# Patient Record
Sex: Female | Born: 1950
Health system: Southern US, Community
[De-identification: ages and names within clinical notes are randomized; demographics above are authoritative.]

## PROBLEM LIST (undated history)

## (undated) DIAGNOSIS — I341 Nonrheumatic mitral (valve) prolapse: Secondary | ICD-10-CM

## (undated) DIAGNOSIS — R5383 Other fatigue: Secondary | ICD-10-CM

## (undated) DIAGNOSIS — A281 Cat-scratch disease: Secondary | ICD-10-CM

## (undated) DIAGNOSIS — E041 Nontoxic single thyroid nodule: Secondary | ICD-10-CM

## (undated) DIAGNOSIS — B009 Herpesviral infection, unspecified: Secondary | ICD-10-CM

## (undated) DIAGNOSIS — Z6827 Body mass index (BMI) 27.0-27.9, adult: Secondary | ICD-10-CM

## (undated) DIAGNOSIS — R0609 Other forms of dyspnea: Secondary | ICD-10-CM

## (undated) DIAGNOSIS — E7800 Pure hypercholesterolemia, unspecified: Secondary | ICD-10-CM

## (undated) DIAGNOSIS — I10 Essential (primary) hypertension: Secondary | ICD-10-CM

## (undated) DIAGNOSIS — R0602 Shortness of breath: Secondary | ICD-10-CM

## (undated) HISTORY — DX: Cat-scratch disease: A28.1

## (undated) HISTORY — DX: Body mass index (BMI) 27.0-27.9, adult: Z68.27

## (undated) HISTORY — DX: Pure hypercholesterolemia, unspecified: E78.00

## (undated) HISTORY — DX: Nontoxic single thyroid nodule: E04.1

## (undated) HISTORY — DX: Other fatigue: R53.83

## (undated) HISTORY — DX: Other forms of dyspnea: R06.09

## (undated) HISTORY — DX: Nonrheumatic mitral (valve) prolapse: I34.1

## (undated) HISTORY — DX: Essential (primary) hypertension: I10

## (undated) HISTORY — DX: Herpesviral infection, unspecified: B00.9

## (undated) HISTORY — DX: Shortness of breath: R06.02

---

## 1997-07-26 ENCOUNTER — Other Ambulatory Visit: Admission: RE | Admit: 1997-07-26 | Discharge: 1997-07-26 | Payer: Self-pay | Admitting: Gynecology

## 1998-11-20 ENCOUNTER — Other Ambulatory Visit: Admission: RE | Admit: 1998-11-20 | Discharge: 1998-11-20 | Payer: Self-pay | Admitting: Gynecology

## 1999-12-25 ENCOUNTER — Other Ambulatory Visit: Admission: RE | Admit: 1999-12-25 | Discharge: 1999-12-25 | Payer: Self-pay | Admitting: Gynecology

## 2000-04-15 ENCOUNTER — Ambulatory Visit (HOSPITAL_COMMUNITY): Admission: RE | Admit: 2000-04-15 | Discharge: 2000-04-15 | Payer: Self-pay | Admitting: Gynecology

## 2001-05-19 ENCOUNTER — Other Ambulatory Visit: Admission: RE | Admit: 2001-05-19 | Discharge: 2001-05-19 | Payer: Self-pay | Admitting: Gynecology

## 2002-01-14 HISTORY — PX: TUBAL LIGATION: SHX77

## 2002-05-31 ENCOUNTER — Other Ambulatory Visit: Admission: RE | Admit: 2002-05-31 | Discharge: 2002-05-31 | Payer: Self-pay | Admitting: Gynecology

## 2003-06-06 ENCOUNTER — Other Ambulatory Visit: Admission: RE | Admit: 2003-06-06 | Discharge: 2003-06-06 | Payer: Self-pay | Admitting: Gynecology

## 2004-05-24 ENCOUNTER — Other Ambulatory Visit: Admission: RE | Admit: 2004-05-24 | Discharge: 2004-05-24 | Payer: Self-pay | Admitting: Gynecology

## 2005-05-07 ENCOUNTER — Other Ambulatory Visit: Admission: RE | Admit: 2005-05-07 | Discharge: 2005-05-07 | Payer: Self-pay | Admitting: Obstetrics & Gynecology

## 2006-01-14 HISTORY — PX: KNEE SURGERY: SHX244

## 2006-05-09 ENCOUNTER — Other Ambulatory Visit: Admission: RE | Admit: 2006-05-09 | Discharge: 2006-05-09 | Payer: Self-pay | Admitting: Obstetrics and Gynecology

## 2007-07-07 ENCOUNTER — Other Ambulatory Visit: Admission: RE | Admit: 2007-07-07 | Discharge: 2007-07-07 | Payer: Self-pay | Admitting: Obstetrics & Gynecology

## 2007-12-15 HISTORY — PX: SHOULDER SURGERY: SHX246

## 2007-12-23 ENCOUNTER — Ambulatory Visit (HOSPITAL_COMMUNITY): Admission: RE | Admit: 2007-12-23 | Discharge: 2007-12-24 | Payer: Self-pay | Admitting: Orthopedic Surgery

## 2008-01-15 HISTORY — PX: TUBAL LIGATION: SHX77

## 2008-03-14 HISTORY — PX: KNEE ARTHROSCOPY: SHX127

## 2010-01-03 ENCOUNTER — Ambulatory Visit (HOSPITAL_COMMUNITY)
Admission: RE | Admit: 2010-01-03 | Discharge: 2010-01-03 | Payer: Self-pay | Source: Home / Self Care | Attending: Obstetrics and Gynecology | Admitting: Obstetrics and Gynecology

## 2010-01-18 ENCOUNTER — Encounter
Admission: RE | Admit: 2010-01-18 | Discharge: 2010-01-18 | Payer: Self-pay | Source: Home / Self Care | Attending: Obstetrics and Gynecology | Admitting: Obstetrics and Gynecology

## 2010-01-23 ENCOUNTER — Encounter
Admission: RE | Admit: 2010-01-23 | Discharge: 2010-01-23 | Payer: Self-pay | Source: Home / Self Care | Attending: Obstetrics and Gynecology | Admitting: Obstetrics and Gynecology

## 2010-01-23 HISTORY — PX: BREAST BIOPSY: SHX20

## 2010-02-14 ENCOUNTER — Encounter: Payer: Self-pay | Admitting: Obstetrics and Gynecology

## 2010-04-25 ENCOUNTER — Inpatient Hospital Stay (HOSPITAL_COMMUNITY): Payer: PRIVATE HEALTH INSURANCE

## 2010-04-25 ENCOUNTER — Inpatient Hospital Stay (HOSPITAL_COMMUNITY)
Admission: EM | Admit: 2010-04-25 | Discharge: 2010-04-26 | DRG: 603 | Disposition: A | Discharge status: Home / Self Care | Payer: PRIVATE HEALTH INSURANCE | Attending: Internal Medicine | Admitting: Internal Medicine

## 2010-04-25 DIAGNOSIS — W64XXXA Exposure to other animate mechanical forces, initial encounter: Secondary | ICD-10-CM | POA: Diagnosis present

## 2010-04-25 DIAGNOSIS — IMO0002 Reserved for concepts with insufficient information to code with codable children: Secondary | ICD-10-CM | POA: Diagnosis present

## 2010-04-25 DIAGNOSIS — Y92009 Unspecified place in unspecified non-institutional (private) residence as the place of occurrence of the external cause: Secondary | ICD-10-CM

## 2010-04-25 DIAGNOSIS — L02419 Cutaneous abscess of limb, unspecified: Principal | ICD-10-CM | POA: Diagnosis present

## 2010-04-25 DIAGNOSIS — Z880 Allergy status to penicillin: Secondary | ICD-10-CM

## 2010-04-25 DIAGNOSIS — L03119 Cellulitis of unspecified part of limb: Principal | ICD-10-CM | POA: Diagnosis present

## 2010-04-25 LAB — COMPREHENSIVE METABOLIC PANEL
ALT: 18 U/L (ref 0–35)
Albumin: 3.7 g/dL (ref 3.5–5.2)
CO2: 24 mEq/L (ref 19–32)
Calcium: 9.2 mg/dL (ref 8.4–10.5)
Creatinine, Ser: 0.79 mg/dL (ref 0.4–1.2)
Glucose, Bld: 104 mg/dL — ABNORMAL HIGH (ref 70–99)
Potassium: 4.1 mEq/L (ref 3.5–5.1)
Total Protein: 7 g/dL (ref 6.0–8.3)

## 2010-04-25 LAB — URINALYSIS, ROUTINE W REFLEX MICROSCOPIC
Bilirubin Urine: NEGATIVE
Hgb urine dipstick: NEGATIVE
Nitrite: NEGATIVE
Protein, ur: NEGATIVE mg/dL

## 2010-04-25 LAB — DIFFERENTIAL
Eosinophils Absolute: 0.1 10*3/uL (ref 0.0–0.7)
Eosinophils Relative: 1 % (ref 0–5)
Lymphocytes Relative: 20 % (ref 12–46)
Monocytes Absolute: 1 10*3/uL (ref 0.1–1.0)
Neutro Abs: 8 10*3/uL — ABNORMAL HIGH (ref 1.7–7.7)

## 2010-04-25 LAB — URINE MICROSCOPIC-ADD ON

## 2010-04-25 LAB — CBC
HCT: 38.3 % (ref 36.0–46.0)
MCH: 27.9 pg (ref 26.0–34.0)
MCV: 82.2 fL (ref 78.0–100.0)
Platelets: 209 10*3/uL (ref 150–400)
WBC: 11.5 10*3/uL — ABNORMAL HIGH (ref 4.0–10.5)

## 2010-04-25 MED ORDER — GADOBENATE DIMEGLUMINE 529 MG/ML IV SOLN
15.0000 mL | Freq: Once | INTRAVENOUS | Status: AC | PRN
  Administered 2010-04-25: 15 mL via INTRAVENOUS

## 2010-04-26 LAB — DIFFERENTIAL
Basophils Absolute: 0 10*3/uL (ref 0.0–0.1)
Eosinophils Relative: 4 % (ref 0–5)
Lymphs Abs: 2.7 10*3/uL (ref 0.7–4.0)
Neutro Abs: 3.9 10*3/uL (ref 1.7–7.7)

## 2010-04-26 LAB — CBC
HCT: 35.8 % — ABNORMAL LOW (ref 36.0–46.0)
Hemoglobin: 11.7 g/dL — ABNORMAL LOW (ref 12.0–15.0)
MCHC: 32.7 g/dL (ref 30.0–36.0)
MCV: 83.8 fL (ref 78.0–100.0)
Platelets: 176 10*3/uL (ref 150–400)
RDW: 13.5 % (ref 11.5–15.5)

## 2010-04-30 NOTE — H&P (Addendum)
Shannon Dickson, Shannon Dickson                  ACCOUNT NO.:  [id number]  MEDICAL RECORD NO.:  [id number]           PATIENT TYPE:  E  LOCATION:  WLED                         FACILITY:  [facility]  PHYSICIAN:  Richarda Overlie, MD       DATE OF BIRTH:  [birth year]  DATE OF ADMISSION:  04/25/2010 DATE OF DISCHARGE:                             HISTORY & PHYSICAL   PRIMARY CARE PROVIDER:  None.  The patient is being admitted to [facility], [employer] [facility].  CHIEF COMPLAINT:  Left knee pain.  HISTORY OF PRESENT ILLNESS:  Ms. Shannon Dickson is a very pleasant 60 year old with virtually no medical history, presents to [facility] ED with chief complaint of left knee pain.  Information is obtained from the patient. She reports that 2 days ago she suffered a scratch from a stray cat that she had taken into her home previously.  By that evening, she indicated that the knee had developed erythema and swelling.  She states that she put ice on it without relief.  During the night, she developed a headache, some chills, subjective fever.  The next morning, she went to Urgent Care and was given doxycycline and clindamycin and the area was marked with ink.  She indicates that she woke up this morning and the erythema was spreading up her thigh and down her shin, and she also developed pain with ambulation.  She became concerned, so she came back to the emergency room.  Symptoms came on gradually, have persisted and worsened.  She rates the pain a 6/10 at worst and a 2/10 at best.  She states that bearing weight makes the pain worse and lying still makes it better.  We are asked to admit for further evaluation and treatment.  ALLERGIES:  PENICILLIN.  PAST MEDICAL HISTORY:  None.  PAST SURGICAL HISTORY: 1. Right shoulder rotator cuff repair. 2. Tubal ligation. 3. Bilateral knee arthroscopies.  SOCIAL HISTORY:  She denies tobacco use.  Denies EtOH.  Denies drug use. She is single.  She lives alone.  She is  self-employed.  FAMILY MEDICAL HISTORY:  She has a twin brother who is in good health. She has a sister who is deceased at age 65 from complications of diabetes.  She has a mother who is 61 and a diabetic.  Her father is deceased at 63 years of age; cause is unclear.  MEDICATIONS: 1. Doxycycline 100 mg p.o. 1 cap b.i.d., started April 10 for a 14-day     therapy. 2. Clindamycin 300 mg p.o. b.i.d., started April 24, 2010, for 10-day     therapy. 3. Claritin 10 mg p.o. daily as needed for allergies. 4. Fish oil over-the-counter p.o. 1 cap daily. 5. Multivitamins p.o. 1 tab daily.  REVIEW OF SYSTEMS:  A 10-point system review conducted and all systems are negative except as indicated in the HPI.  LABS:  Sodium 137, potassium 4.1, chloride 107, CO2 24, BUN 15, creatinine 0.79, glucose 104.  WBCs 11.5, hemoglobin 13.0, hematocrit 38.3, platelets 209, absolute neutrophils 8.0.  PHYSICAL EXAM:  VITAL SIGNS:  Temperature 98.3, BP 126/63, heart rate  71, respirations 16, sats 97% on room air. GENERAL:  Awake, alert, sitting up in bed eating a sandwich, no acute distress. HEAD:  Normocephalic, atraumatic.  Pupils equal, round, and reactive to light.  EOMI.  Mucous membranes of her mouth are moist and pink.  No obvious lesion or exudate in her nose or ears. NECK:  Supple.  No JVD.  Full range of motion.  No lymphadenopathy. CV:  Regular rate and rhythm.  No murmur, gallop, or rub.  No lower extremity edema. RESPIRATORY:  No increased work of breathing.  Breath sounds clear to auscultation bilaterally.  No rhonchi, wheezes, or rales. ABDOMEN:  Flat, soft, positive bowel sounds throughout, nontender to palpation.  No mass or organomegaly noted. NEURO:  Alert and oriented x3.  Speech clear.  Facial symmetry.  Cranial nerves 2-12 grossly intact. MUSCULOSKELETAL:  Moves all extremities spontaneously.  Left knee with mild swelling and moderate erythema anteriorly.  Erythema extending above and  below the knee.  Warm and tender to touch.  Question mild streaking to upper thigh.  No fluctuance of knee or tightness/shininess to the skin.  ASSESSMENT/PLAN: 1. Cellulitis of the left knee/question septic bursitis secondary to     cat scratch.  Admit to regular bed, provide intravenous vancomycin     and imipenem.  Will monitor closely.  Will get an MRI of the knee     and the left distal femur to rule out septic arthritis and septic     bursitis/abscess. 2. Mild leukocytosis secondary to #1.  Intravenous antibiotics as     indicated in above.  Will check CBC in the a.m. 3. Deep vein thrombosis prophylaxis.  Will use Lovenox. 4. Code status:  The patient is a full code.  Assessment and plan discussed with Dr. Susie Cassette.     Gwenyth Bender, NP   ______________________________ Richarda Overlie, MD    KMB/MEDQ  D:  04/25/2010  T:  04/25/2010  Job:  [id number]  Electronically Signed by Richarda Overlie MD on 04/29/2010 04:30:14 PM Electronically Signed by Toya Smothers  on 05/01/2010 10:27:15 AM

## 2010-05-07 NOTE — Discharge Summary (Signed)
  NAMEFUMIKO, Shannon Dickson                  ACCOUNT NO.:  [id number]  MEDICAL RECORD NO.:  [id number]           PATIENT TYPE:  I  LOCATION:  1341                         FACILITY:  [facility]  PHYSICIAN:  Hollice Espy, M.D.DATE OF BIRTH:  Dec 19, 1950  DATE OF ADMISSION:  04/25/2010 DATE OF DISCHARGE:  04/26/2010                              DISCHARGE SUMMARY   PRIMARY CARE PHYSICIAN:  She does not have a primary care physician.  I have given her number of several primary care physicians and she previously was receiving her followups at Urgent Care.  DISCHARGE DIAGNOSIS:  Cellulitis of the right thigh felt to be secondary to a cat scratch.  DISCHARGE MEDICATIONS:  Bactrim DS 1 p.o. b.i.d. x6 days.  HOSPITAL COURSE:  The patient is a 60-year white female with no essential past medical history other than seasonal rhinitis who presented to the emergency room on April 25, 2010.  She had been scratched the day before and had started given some redness and swelling.  She went to Urgent Care and was given prescriptions for doxycycline and clindamycin.  She started taking those but swelling had increased and the erythema had gone up her leg.  She then went to the emergency room on April 25, 2010.  She was brought in, her white count was mildly elevated, and she was started on broad-spectrum IV antibiotics, specifically Zosyn and vancomycin.  By hospital day #2, her white count had normalized.  She was afebrile, feeling better.  The redness had gone down significantly and the plan is for the patient to be discharged home with 6 more days of p.o. Bactrim.  We are not able to use Augmentin because of her penicillin allergy.  DISPOSITION:  Improved.  ACTIVITY:  Slowly increased.  DISCHARGE DIET:  Regular diet and she is being discharged to home.     Hollice Espy, M.D.     SKK/MEDQ  D:  04/26/2010  T:  04/27/2010  Job:  [id number]  Electronically Signed by Virginia Rochester M.D. on  05/07/2010 02:02:04 PM

## 2010-05-29 NOTE — Op Note (Signed)
Shannon Dickson, Shannon Dickson                  ACCOUNT NO.:  [id number]   MEDICAL RECORD NO.:  [id number]          PATIENT TYPE:  AMB   LOCATION:  DAY                          FACILITY:  [facility]   PHYSICIAN:  Ronald A. Gioffre, M.D.DATE OF BIRTH:  December 03, 1950   DATE OF PROCEDURE:  12/23/2007  DATE OF DISCHARGE:                               OPERATIVE REPORT   ASSISTANT:  Jamelle Rushing, P.A.   PREOPERATIVE DIAGNOSES:  1. Severe impingement syndrome right shoulder.  2. Torn rotator cuff tendon right shoulder secondary to the      impingement.  3. Questionable subluxing long head of the biceps.   POSTOPERATIVE DIAGNOSES:  1. Severe impingement syndrome right shoulder.  2. Partial tear rotator cuff tendon right shoulder.   OPERATIVE PROCEDURES:  1. Open acromionectomy and acromioplasty utilizing the oscillating saw      and bur.  2. Repair of the rotator cuff tendon tear, partial-thickness utilizing      a tissue mend graft.  We also used one anchor.   PROCEDURE:  Under general anesthesia routine orthopedic prep and drape  of the right upper extremity was carried out.  She had clindamycin 600  mg IV.  Following that,  incision was made over the anterior aspect of  the right shoulder.  Bleeders were identified and cauterized.  I then  detached by sharp dissection the deltoid tendon from the superior aspect  of the acromion both medially and laterally and went down and noted a  rather severe impingement syndrome - type picture.  Her acromion  literally was embedding into the rotator cuff when she came up into  abduction.  I protected the cuff after removing a large chronically  inflamed  subdeltoid bursa.  The cuff then was protected with a Cytogeneticist.   I utilized the oscillating saw and did a partial acromionectomy and  acromioplasty with a bur and thoroughly irrigated out the area and then  made sure we had a nice smooth surface of the undersurface of the  acromion and I bone waxed  that.  We thoroughly irrigated out the area  and went down and examined and the long head of the biceps was not  exposed.  I did examine it,  both rotating the shoulder internally and  externally and abducting shoulder and there certainly it was definitely  intact in the groove and there was no tenodesis indicated.  There were  no other abnormalities noted at the time of surgery here.   We thoroughly irrigated out the area and I then utilized a tissue mend  graft, a  small graft 2 x 2 cm.  I inserted one anchor in the proximal  humerus and sutured the graft back down over the repair site of the  tendon.  I thoroughly irrigated out the area, reapproximated the deltoid  tendon with #1 Ethibond suture and the remaining part of muscle  was closed with 0 Vicryl, subcu with 0Vicryl, skin with metal staples.  Prior to closing the wound I did insert some thrombin-soaked Gelfoam in  the subacromial space.  Sterile  dressings were applied.  The patient was  placed in a shoulder immobilizer.           ______________________________  Georges Lynch Darrelyn Hillock, M.D.     RAG/MEDQ  D:  12/23/2007  T:  12/23/2007  Job:  [id number]

## 2010-06-01 NOTE — Op Note (Signed)
[facility]  Patient:    Shannon Dickson, Shannon Dickson                         MRN: [id number] Proc. Date: 04/15/00 Adm. Date:  [id number] Attending:  Susa Raring                           Operative Report  PREOPERATIVE DIAGNOSIS:  Sterilization.  POSTOPERATIVE DIAGNOSES: 1. Sterilization. 2. Small subserosal fibroids.  OPERATION:  Laparoscopy and tubal sterilization.  SURGEON:  Luvenia Redden, M.D.  PROCEDURE:  Under good anesthesia the patient was prepped and draped in a sterile manner.  The bladder was catheterized and a Hulka tenaculum placed on the uterus to achieve mobility.  A subumbilical incision was made, a Veress needle inserted intraperitoneally.  Placement was verified by negative pressure reading on the manometer, without protraction of the abdominal wall. Pneumoperitoneum was formed with carbon dioxide.  The needle was removed.  The laparoscopic trocar and cannula was introduced.  The trocar removed and the operative laparoscope introduced.  Under direct vision, pelvic organs were viewed after the small intestine was mobilized out of the area.  The uterus was of normal size; it was anterior.  There were two small subserosal fibroids present on the fundus anteriorly, and one posteriorly.  These were approximately 5-10 mm in diameter.  Tubes and ovaries appeared normal.  The pelvic peritoneum appeared normal.  There was a corpus luteum present in the right ovary.  The right fallopian tube was identified throughout its length, isolated, grasped at the junction of proximal middle third, tented up and cauterized with bipolar instrument.  The tube was then transected in the mid portion of the cauterized area, and there was no bleeding.  The left fallopian tube was identified throughout its length, grasped at the junction of the proximal middle third, tented up and cauterized to 0 with bipolar instrument. The tube was then transected in the mid  portion of the cauterized area.  There was no bleeding from this operative site.  The operative sites were reviewed under reduced intraperitoneal pressure, and there was no active bleeding. Pneumoperitoneum was completely released.  Instruments were removed.  The skin incision was closed with subcuticular 2-0 plain catgut.  Band-Aid was applied.  Blood loss was estimated less than 10 cc; none was replaced.  The patient tolerated the procedure well and was removed to the recovery room in good condition. DD:  04/15/00 TD:  04/15/00 Job: [id number]

## 2010-10-18 LAB — DIFFERENTIAL
Basophils Absolute: 0 10*3/uL (ref 0.0–0.1)
Basophils Relative: 0 % (ref 0–1)
Eosinophils Absolute: 0.2 10*3/uL (ref 0.0–0.7)
Eosinophils Relative: 3 % (ref 0–5)
Lymphs Abs: 1.9 10*3/uL (ref 0.7–4.0)
Neutrophils Relative %: 66 % (ref 43–77)

## 2010-10-18 LAB — URINALYSIS, ROUTINE W REFLEX MICROSCOPIC
Ketones, ur: NEGATIVE mg/dL
Specific Gravity, Urine: 1.018 (ref 1.005–1.030)

## 2010-10-18 LAB — PROTIME-INR
INR: 1 (ref 0.00–1.49)
Prothrombin Time: 12.9 seconds (ref 11.6–15.2)

## 2010-10-18 LAB — COMPREHENSIVE METABOLIC PANEL
ALT: 15 U/L (ref 0–35)
Alkaline Phosphatase: 69 U/L (ref 39–117)
BUN: 10 mg/dL (ref 6–23)
Glucose, Bld: 87 mg/dL (ref 70–99)
Potassium: 3.7 mEq/L (ref 3.5–5.1)
Total Bilirubin: 0.7 mg/dL (ref 0.3–1.2)
Total Protein: 7.5 g/dL (ref 6.0–8.3)

## 2010-10-18 LAB — URINE CULTURE: Colony Count: 40000

## 2010-10-18 LAB — CBC
HCT: 44.3 % (ref 36.0–46.0)
Hemoglobin: 15 g/dL (ref 12.0–15.0)
MCHC: 33.8 g/dL (ref 30.0–36.0)
MCV: 85.2 fL (ref 78.0–100.0)
Platelets: 318 10*3/uL (ref 150–400)
RBC: 5.2 MIL/uL — ABNORMAL HIGH (ref 3.87–5.11)
RDW: 13.2 % (ref 11.5–15.5)
WBC: 7.2 10*3/uL (ref 4.0–10.5)

## 2010-10-18 LAB — TYPE AND SCREEN
ABO/RH(D): O POS
Antibody Screen: NEGATIVE

## 2010-10-18 LAB — ABO/RH: ABO/RH(D): O POS

## 2011-02-26 ENCOUNTER — Other Ambulatory Visit (HOSPITAL_COMMUNITY): Payer: Self-pay | Admitting: Occupational Therapy

## 2011-10-16 LAB — HM COLONOSCOPY

## 2011-12-23 ENCOUNTER — Encounter: Payer: Self-pay | Admitting: Cardiovascular Disease

## 2011-12-23 ENCOUNTER — Ambulatory Visit (INDEPENDENT_AMBULATORY_CARE_PROVIDER_SITE_OTHER): Payer: BC Managed Care – PPO | Admitting: Cardiovascular Disease

## 2011-12-23 VITALS — BP 136/72 | HR 61 | Ht 64.0 in | Wt 147.8 lb

## 2011-12-23 DIAGNOSIS — R0989 Other specified symptoms and signs involving the circulatory and respiratory systems: Secondary | ICD-10-CM

## 2011-12-23 DIAGNOSIS — R0600 Dyspnea, unspecified: Secondary | ICD-10-CM

## 2011-12-23 NOTE — Progress Notes (Signed)
    Shannon Dickson Date of Birth  1950-04-03       [facility]    [place] [address]  [address], [address] [place], [place]  [address]   [place], [place]  [address] [phone]     [phone]   Fax  [phone]    Fax [phone]  Problem List: 1. Dyspnea 2.   History of Present Illness:  Shannon Dickson is a 61 yo with chronic dyspnea.   She denies any chest pain.  Family history of asthma. She works in [employer].  She has exercised regularly in the past. but hasn't in the past.  No syncope. No Chest pain.    does not sleep well at night.  She had a cardiopulmonary stress test about 4-5 years ago which was normal.   No current outpatient prescriptions on file prior to visit.    Allergies  Allergen Reactions  . Penicillins     Not Pure but can have it mixed with other things    Past Medical History  Diagnosis Date  . SOB (shortness of breath)   . MVP (mitral valve prolapse)     about 30 yrs ago    Past Surgical History  Procedure Date  . Knee surgery   . Tubal ligation   . Shoulder surgery     right    History  Smoking status  . Never Smoker   Smokeless tobacco  . Not on file    History  Alcohol Use  . Yes    Comment: Social    Family History  Problem Relation Age of Onset  . Asthma      aunt    Reviw of Systems:  Reviewed in the HPI.  All other systems are negative.  Physical Exam: Blood pressure 136/72, pulse 61, height 5\' 4"  (1.626 m), weight 147 lb 12.8 oz (67.042 kg). General: Well developed, well nourished, in no acute distress.  Head: Normocephalic, atraumatic, sclera non-icteric, mucus membranes are moist,   Neck: Supple. Carotids are 2 + without bruits. No JVD   Lungs: Clear   Heart: RR, soft systolic murmur  Abdomen: Soft, non-tender, non-distended with normal bowel sounds.  Msk:  Strength and tone are normal   Extremities: No clubbing or cyanosis. No edema.  Distal pedal pulses are 2+ and equal      Neuro: CN II - XII intact.  Alert and oriented X 3.   Psych:  Normal   ECG: 12/23/2011: Sinus bradycardia 58. EKG is otherwise normal.  Assessment / Plan:

## 2011-12-23 NOTE — Assessment & Plan Note (Signed)
Shannon Dickson presents for further evaluation of dyspnea with exertion. She overall is a very healthy 61 year old female. She has noticed exertional dyspnea for the past 20 years. She thinks that she ought to be able to do more than she does since  she is in good shape. She has noticed that she's always breathing harder than her "workout buddies".  We will get an echocardiogram for further evaluation cardiac structure. She may have some degree of diastolic dysfunction. I would also like to do a Cardiolite stress test since I will help to differentiate a cardiac etiology versus a pulmonary etiology. If these turn out to be normal then we will refer her back to her medical Dr.  I will see  her again in several months to discuss the results of these.

## 2011-12-23 NOTE — Patient Instructions (Addendum)
Your physician has recommended that you have a cardiopulmonary stress test (CPX). CPX testing is a non-invasive measurement of heart and lung function. It replaces a traditional treadmill stress test. This type of test provides a tremendous amount of information that relates not only to your present condition but also for future outcomes. This test combines measurements of you ventilation, respiratory gas exchange in the lungs, electrocardiogram (EKG), blood pressure and physical response before, during, and following an exercise protocol.  Your physician has requested that you have an echocardiogram. Echocardiography is a painless test that uses sound waves to create images of your heart. It provides your doctor with information about the size and shape of your heart and how well your heart's chambers and valves are working. This procedure takes approximately one hour. There are no restrictions for this procedure.  Your physician recommends that you schedule a follow-up appointment in: 1-2 months with Dr Elease Hashimoto

## 2012-01-13 ENCOUNTER — Encounter (HOSPITAL_COMMUNITY): Payer: BC Managed Care – PPO

## 2012-01-13 ENCOUNTER — Other Ambulatory Visit (HOSPITAL_COMMUNITY): Payer: BC Managed Care – PPO

## 2012-01-15 DIAGNOSIS — R0602 Shortness of breath: Secondary | ICD-10-CM

## 2012-01-15 HISTORY — DX: Shortness of breath: R06.02

## 2012-02-03 ENCOUNTER — Ambulatory Visit (HOSPITAL_COMMUNITY): Payer: BC Managed Care – PPO | Attending: Cardiovascular Disease

## 2012-02-03 ENCOUNTER — Ambulatory Visit (HOSPITAL_COMMUNITY): Payer: BC Managed Care – PPO | Attending: Cardiology | Admitting: Radiology

## 2012-02-03 DIAGNOSIS — I369 Nonrheumatic tricuspid valve disorder, unspecified: Secondary | ICD-10-CM | POA: Insufficient documentation

## 2012-02-03 DIAGNOSIS — R0989 Other specified symptoms and signs involving the circulatory and respiratory systems: Secondary | ICD-10-CM | POA: Insufficient documentation

## 2012-02-03 DIAGNOSIS — R0609 Other forms of dyspnea: Secondary | ICD-10-CM | POA: Insufficient documentation

## 2012-02-03 DIAGNOSIS — I059 Rheumatic mitral valve disease, unspecified: Secondary | ICD-10-CM | POA: Insufficient documentation

## 2012-02-03 DIAGNOSIS — R0600 Dyspnea, unspecified: Secondary | ICD-10-CM

## 2012-02-03 NOTE — Progress Notes (Signed)
Echocardiogram performed.  

## 2012-02-06 ENCOUNTER — Telehealth: Payer: Self-pay | Admitting: Cardiovascular Disease

## 2012-02-06 NOTE — Telephone Encounter (Signed)
New problem:  Test results.  

## 2012-02-06 NOTE — Telephone Encounter (Signed)
See echo note, results given

## 2012-02-18 ENCOUNTER — Ambulatory Visit (INDEPENDENT_AMBULATORY_CARE_PROVIDER_SITE_OTHER): Payer: BC Managed Care – PPO | Admitting: Cardiovascular Disease

## 2012-02-18 ENCOUNTER — Encounter: Payer: Self-pay | Admitting: Cardiovascular Disease

## 2012-02-18 VITALS — BP 141/75 | HR 60 | Ht 64.0 in | Wt 148.4 lb

## 2012-02-18 DIAGNOSIS — R0600 Dyspnea, unspecified: Secondary | ICD-10-CM

## 2012-02-18 DIAGNOSIS — R0989 Other specified symptoms and signs involving the circulatory and respiratory systems: Secondary | ICD-10-CM

## 2012-02-18 NOTE — Patient Instructions (Addendum)
Your physician recommends that you schedule a follow-up appointment in: AS NEEDED  Your physician recommends that you continue on your current medications as directed. Please refer to the Current Medication list given to you today.  

## 2012-02-18 NOTE — Assessment & Plan Note (Signed)
Ensley has had a normal cardiopulmonary stress test and an unremarkable echocardiogram. She has normal left ventricular systolic function. She does have some mild diastolic dysfunction. She states that she has been short of breath while exercising almost all of her life.  At this point I don't have anything further to offer. I recommended that she a Systems analyst to help her with her workout. I don't think there is anything structurally wrong with her heart that we can fix. I'll see her back as needed.

## 2012-02-18 NOTE — Progress Notes (Signed)
    Hans Eden Date of Birth  1950/01/17       [facility]    [place] [address]  [address] [place], [place]  [address]   [place], [place]  [address] [phone]     [phone]   Fax  [phone]    Fax [phone]  Problem List: 1. Dyspnea   History of Present Illness:  Shannon Dickson is a 62 yo with chronic dyspnea.   She denies any chest pain.  Family history of asthma. She works in [employer].  She has exercised regularly in the past. but hasn't in the past.  No syncope. No Chest pain.    does not sleep well at night.  She had a cardiopulmonary stress test about 4-5 years ago which was normal.   Feb. 4, 2014: Makenzye  had a cardiopulmonary stress test on her last visit which was normal. She also had an echocardiogram which revealed normal left ventricular systolic function. She had not mild diastolic dysfunction.  Current Outpatient Prescriptions on File Prior to Visit  Medication Sig Dispense Refill  . cholecalciferol (VITAMIN D) 1000 UNITS tablet Take 1,000 Units by mouth daily.      . Multiple Vitamins-Minerals (MULTIVITAMIN PO) Take by mouth.        Allergies  Allergen Reactions  . Penicillins     Not Pure but can have it mixed with other things    Past Medical History  Diagnosis Date  . SOB (shortness of breath)   . MVP (mitral valve prolapse)     about 30 yrs ago    Past Surgical History  Procedure Date  . Knee surgery   . Tubal ligation   . Shoulder surgery     right    History  Smoking status  . Never Smoker   Smokeless tobacco  . Not on file    History  Alcohol Use  . Yes    Comment: Social    Family History  Problem Relation Age of Onset  . Asthma      aunt    Reviw of Systems:  Reviewed in the HPI.  All other systems are negative.  Physical Exam: Blood pressure 141/75, pulse 60, height 5\' 4"  (1.626 m), weight 148 lb 6.4 oz (67.314 kg). General: Well developed, well nourished, in no acute  distress.  Head: Normocephalic, atraumatic, sclera non-icteric, mucus membranes are moist,   Neck: Supple. Carotids are 2 + without bruits. No JVD   Lungs: Clear   Heart: RR, soft systolic murmur  Abdomen: Soft, non-tender, non-distended with normal bowel sounds.  Msk:  Strength and tone are normal   Extremities: No clubbing or cyanosis. No edema.  Distal pedal pulses are 2+ and equal    Neuro: CN II - XII intact.  Alert and oriented X 3.   Psych:  Normal   ECG:  Assessment / Plan:

## 2012-04-14 ENCOUNTER — Ambulatory Visit
Admission: RE | Admit: 2012-04-14 | Discharge: 2012-04-14 | Disposition: A | Discharge status: Home / Self Care | Payer: BC Managed Care – PPO | Source: Ambulatory Visit | Attending: Otolaryngology | Admitting: Otolaryngology

## 2012-04-14 ENCOUNTER — Other Ambulatory Visit: Payer: Self-pay | Admitting: Otolaryngology

## 2012-04-14 DIAGNOSIS — J40 Bronchitis, not specified as acute or chronic: Secondary | ICD-10-CM

## 2012-07-30 ENCOUNTER — Ambulatory Visit: Payer: Self-pay | Admitting: Nurse Practitioner

## 2012-08-03 ENCOUNTER — Ambulatory Visit: Payer: Self-pay | Admitting: Nurse Practitioner

## 2012-08-11 ENCOUNTER — Ambulatory Visit: Payer: Self-pay | Admitting: Nurse Practitioner

## 2012-08-13 ENCOUNTER — Ambulatory Visit: Payer: Self-pay | Admitting: Nurse Practitioner

## 2012-08-19 ENCOUNTER — Ambulatory Visit (INDEPENDENT_AMBULATORY_CARE_PROVIDER_SITE_OTHER): Payer: BC Managed Care – PPO | Admitting: Nurse Practitioner

## 2012-08-19 ENCOUNTER — Encounter: Payer: Self-pay | Admitting: Nurse Practitioner

## 2012-08-19 VITALS — BP 100/72 | HR 60 | Ht 63.75 in | Wt 143.0 lb

## 2012-08-19 DIAGNOSIS — Z0000 Encounter for general adult medical examination without abnormal findings: Secondary | ICD-10-CM

## 2012-08-19 DIAGNOSIS — Z01419 Encounter for gynecological examination (general) (routine) without abnormal findings: Secondary | ICD-10-CM

## 2012-08-19 LAB — HEMOGLOBIN, FINGERSTICK: Hemoglobin, fingerstick: 14 g/dL (ref 12.0–16.0)

## 2012-08-19 LAB — COMPREHENSIVE METABOLIC PANEL
Albumin: 4.3 g/dL (ref 3.5–5.2)
BUN: 15 mg/dL (ref 6–23)
Calcium: 9.2 mg/dL (ref 8.4–10.5)
Chloride: 105 mEq/L (ref 96–112)
Glucose, Bld: 94 mg/dL (ref 70–99)
Potassium: 4.2 mEq/L (ref 3.5–5.3)

## 2012-08-19 LAB — LIPID PANEL
HDL: 64 mg/dL (ref >39)
LDL Cholesterol: 159 mg/dL — ABNORMAL HIGH (ref 0–99)
Triglycerides: 90 mg/dL (ref <150)

## 2012-08-19 NOTE — Patient Instructions (Signed)

## 2012-08-19 NOTE — Progress Notes (Signed)
Patient ID: Shannon Dickson, female   DOB: [birth year], 62 y.o.   MRN: [id number] 62 y.o. .  G0 SW Fe Caucasian Fe here for annual exam.  Same partner X 6 years. No new health problems.  Her mother is now 40 and having to require constant care. Nephew live with her mother and gives her care.  No LMP recorded. Patient is postmenopausal.          Sexually active: yes  The current method of family planning is tubal ligation.    Exercising: yes  Gym/ health club routine includes tennis, bike riding. Smoker:  no  Health Maintenance: Pap:  07/29/11, WNL, neg HR HPV MMG:  04/06/12, BI-Rads 2, Benign findings Colonoscopy:  10/16/11, hyperplastic polyp, rpt 5 years BMD:   2008 TDaP:  04/23/10 Labs: HB:  14.0 Urine:  Trace protein, pH 5.0   reports that she has never smoked. She has never used smokeless tobacco. She reports that  drinks alcohol. She reports that she does not use illicit drugs.  Past Medical History  Diagnosis Date  . SOB (shortness of breath)   . MVP (mitral valve prolapse)     about 30 yrs ago  . HSV infection     Past Surgical History  Procedure Laterality Date  . Knee surgery Left     arthroscopy  . Tubal ligation    . Shoulder surgery Right 12/09       . Knee arthroscopy Right 3/10  . Breast biopsy Right 01/23/10    fibroadenoma w/microcalcifications    Current Outpatient Prescriptions  Medication Sig Dispense Refill  . cholecalciferol (VITAMIN D) 1000 UNITS tablet Take 1,000 Units by mouth daily.      . Multiple Vitamins-Minerals (MULTIVITAMIN PO) Take by mouth.       No current facility-administered medications for this visit.    Family History  Problem Relation Age of Onset  . Asthma      aunt  . Diabetes Mother   . Diabetes Sister     deceased    ROS:  Pertinent items are noted in HPI.  Otherwise, a comprehensive ROS was negative.  Exam:   BP 100/72  Pulse 60  Ht 5' 3.75" (1.619 m)  Wt 143 lb (64.864 kg)  BMI 24.75 kg/m2 Height: 5' 3.75" (161.9 cm)   Ht Readings from Last 3 Encounters:  08/19/12 5' 3.75" (1.619 m)  02/18/12 5\' 4"  (1.626 m)  12/23/11 5\' 4"  (1.626 m)    General appearance: alert, cooperative and appears stated age Head: Normocephalic, without obvious abnormality, atraumatic Neck: no adenopathy, supple, symmetrical, trachea midline and thyroid normal to inspection and palpation Lungs: clear to auscultation bilaterally Breasts: normal appearance, no masses or tenderness Heart: regular rate and rhythm Abdomen: soft, non-tender; no masses,  no organomegaly Extremities: extremities normal, atraumatic, no cyanosis or edema Skin: Skin color, texture, turgor normal. No rashes or lesions Lymph nodes: Cervical, supraclavicular, and axillary nodes normal. No abnormal inguinal nodes palpated Neurologic: Grossly normal   Pelvic: External genitalia:  no lesions              Urethra:  normal appearing urethra with no masses, tenderness or lesions              Bartholin's and Skene's: normal                 Vagina: normal appearing vagina with normal color and discharge, no lesions  Cervix: anteverted              Pap taken: no Bimanual Exam:  Uterus:  normal size, contour, position, consistency, mobility, non-tender              Adnexa: no mass, fullness, tenderness               Rectovaginal: Confirms               Anus:  normal sphincter tone, no lesions  A:  Well Woman with normal exam  Postmenopausal  History of HSV with rare occurrence  Recent dyspnea with negative cardio, pulmonary and ENT evaluation   P:   Pap smear as per guidelines   Mammogram due 3/15  Will follow with labs  Counseled on breast self exam, adequate intake of calcium and vitamin D, diet and exercise return annually or prn  An After Visit Summary was printed and given to the patient.

## 2012-08-21 NOTE — Progress Notes (Signed)
Encounter reviewed by Dr. Tashon Capp Silva.  

## 2012-09-15 ENCOUNTER — Telehealth: Payer: Self-pay | Admitting: Nurse Practitioner

## 2012-09-15 NOTE — Telephone Encounter (Signed)
Patient needs to find a good hand specialist . Can you advise .

## 2012-09-16 NOTE — Telephone Encounter (Signed)
Spoke with [name], patient asking for a good hand specialist per [place] Dana Dr. Josephine Igo @[facility] Called patient and gave her this info and a phone to call to schedule an appointment [phone].

## 2012-10-26 ENCOUNTER — Telehealth: Payer: Self-pay | Admitting: Nurse Practitioner

## 2012-10-26 NOTE — Telephone Encounter (Signed)
Patient wants to know what her estrogen levels were. She didn't know if they had tested for it. Is concerned with sleepless nights incontinent, weight gain around waist line

## 2012-10-26 NOTE — Telephone Encounter (Signed)
Patient is calling and requesting that orders for estrogen be placed. I advised will need OV to discuss HRT. Patient agreeable.   Patient states she  "I don't have any balance in my body".

## 2012-10-27 ENCOUNTER — Ambulatory Visit (INDEPENDENT_AMBULATORY_CARE_PROVIDER_SITE_OTHER): Payer: BC Managed Care – PPO | Admitting: Nurse Practitioner

## 2012-10-27 ENCOUNTER — Encounter: Payer: Self-pay | Admitting: Nurse Practitioner

## 2012-10-27 VITALS — BP 108/60 | HR 68 | Ht 63.75 in | Wt 152.0 lb

## 2012-10-27 DIAGNOSIS — N959 Unspecified menopausal and perimenopausal disorder: Secondary | ICD-10-CM

## 2012-10-27 DIAGNOSIS — Z0000 Encounter for general adult medical examination without abnormal findings: Secondary | ICD-10-CM

## 2012-10-27 NOTE — Progress Notes (Signed)
Patient ID: Shannon Dickson, female   DOB: [birth year], 62 y.o.   MRN: [id number] S:  62 yo DW Fe presents for a consult to discuss  postmenopausal symptoms.  She went through menopause in 2003.  In 04/2007 she started on HRT secondary to vaso symptoms.  After initial time she then only took med's prn for symptoms for 2 months.  Since then on and off vaso symptoms that are tolerable.  She is now complaining of mid abdomen weight gain and wonders if bio-identical HRT would be best for her. She has been very active in her lifetime but now admits to not playing golf or tennis.  Not going to the gym and exercising. Some times has late night meals.  Discussion:  Reviewed potential risks of HRT - with increased risk of DVT, CVA, cancer, etc. Reviewed [facility] recommendations and study findings.   Also reviewed bio identical HRT and potential risk and benefits.  She agrees that she does not need HRT. We also discussed weight gain and slowing metabolism after menopause.  She will need to increase her exercise in some way to benefit her cardiovascular and weight status.  She has done some research into beneficial elements that your body needs and request that we do a magnesium level.  Her CMP, TSH, Vit D, and lipids were just done at [facility] on 08/19/12.   Past Medical History:  Diagnosed with MVP years ago.  Has seen cardiologist in this past year and stress test with echo were normal.  She also has history of dypnea with a negative CXR.   Family Medical History:  Maternal Aunt with breast cancer - unsure of age.  Mother  And sister with diabetes. ROS:  Negative except occasional insomnia.   She will try various ways to improve her diet and exercise routine and if any further questions will call bask.  Consult time face to face was 18 minutes.

## 2012-11-03 NOTE — Progress Notes (Signed)
Encounter reviewed by Dr. Brook Silva.  

## 2012-11-05 ENCOUNTER — Telehealth: Payer: Self-pay | Admitting: Nurse Practitioner

## 2012-11-05 NOTE — Telephone Encounter (Signed)
Pt wants itemized bill for 10/27/12

## 2012-12-01 NOTE — Telephone Encounter (Signed)
Lab Results  Component Value Date   TSH 1.343 08/19/2012   Patient advised of previous thyroid level.  No further questions from patient.

## 2012-12-01 NOTE — Telephone Encounter (Signed)
Pt wants to know what her thyroid numbers are from the last time she had that checked.

## 2013-08-23 ENCOUNTER — Encounter: Payer: Self-pay | Admitting: Nurse Practitioner

## 2013-08-23 ENCOUNTER — Ambulatory Visit (INDEPENDENT_AMBULATORY_CARE_PROVIDER_SITE_OTHER): Payer: BC Managed Care – PPO | Admitting: Nurse Practitioner

## 2013-08-23 VITALS — BP 134/76 | HR 60 | Ht 63.5 in | Wt 152.0 lb

## 2013-08-23 DIAGNOSIS — Z0000 Encounter for general adult medical examination without abnormal findings: Secondary | ICD-10-CM

## 2013-08-23 DIAGNOSIS — Z01419 Encounter for gynecological examination (general) (routine) without abnormal findings: Secondary | ICD-10-CM

## 2013-08-23 DIAGNOSIS — E2839 Other primary ovarian failure: Secondary | ICD-10-CM

## 2013-08-23 LAB — POCT URINALYSIS DIPSTICK
BILIRUBIN UA: NEGATIVE
GLUCOSE UA: NEGATIVE
KETONES UA: NEGATIVE
Leukocytes, UA: NEGATIVE
NITRITE UA: NEGATIVE
PH UA: 6
Protein, UA: NEGATIVE
RBC UA: NEGATIVE
Urobilinogen, UA: NEGATIVE

## 2013-08-23 LAB — HEMOGLOBIN, FINGERSTICK: Hemoglobin, fingerstick: 13.4 g/dL (ref 12.0–16.0)

## 2013-08-23 NOTE — Progress Notes (Signed)
Patient ID: Shannon Dickson, female   DOB: [birth year], 63 y.o.   MRN: [id number] 63 y.o. G0P0 Significant Other Caucasian Fe here for annual exam.  Some vaso symptoms that are tolerable.  Not having vaginal dryness. Same partner for 7 years.  She wants cortisol level checked.   Patient's last menstrual period was 08/21/2007.          Sexually active: Yes.    The current method of family planning is post menopausal status.    Exercising: Yes.    Gym/ health club routine includes tennis, bike, badminton, walking. Smoker:  no  Health Maintenance: Pap:  07/29/11, WNL, neg HR HPV MMG:  04/13/13, normal Colonoscopy:  10/16/11, hyperplastic polyp, rpt 5 years BMD:   2008 - heel screen with [facility] and was normal TDaP:  04/23/10 Labs: HB:  13.4  Urine:  Negative    reports that she has never smoked. She has never used smokeless tobacco. She reports that she drinks alcohol. She reports that she does not use illicit drugs.  Past Medical History  Diagnosis Date  . SOB (shortness of breath) 01/2012    negative cardio, ENT, and pulmonary evaluation  . MVP (mitral valve prolapse)     about 30 yrs ago  . HSV infection     Past Surgical History  Procedure Laterality Date  . Knee surgery Left 2008    arthroscopy  . Shoulder surgery Right 12/09       . Knee arthroscopy Right 3/10  . Breast biopsy Right 01/23/10    fibroadenoma w/microcalcifications  . Tubal ligation  2010    Current Outpatient Prescriptions  Medication Sig Dispense Refill  . Multiple Vitamins-Minerals (MULTIVITAMIN PO) Take by mouth.       No current facility-administered medications for this visit.    Family History  Problem Relation Age of Onset  . Asthma      aunt  . Diabetes Mother   . Diabetes Sister     deceased    ROS:  Pertinent items are noted in HPI.  Otherwise, a comprehensive ROS was negative.  Exam:   BP 134/76  Pulse 60  Ht 5' 3.5" (1.613 m)  Wt 152 lb (68.947 kg)  BMI 26.50 kg/m2  LMP 08/21/2007  Height: 5' 3.5" (161.3 cm)  Ht Readings from Last 3 Encounters:  08/23/13 5' 3.5" (1.613 m)  10/27/12 5' 3.75" (1.619 m)  08/19/12 5' 3.75" (1.619 m)    General appearance: alert, cooperative and appears stated age Head: Normocephalic, without obvious abnormality, atraumatic Neck: no adenopathy, supple, symmetrical, trachea midline and thyroid normal to inspection and palpation Lungs: clear to auscultation bilaterally Breasts: normal appearance, no masses or tenderness Heart: regular rate and rhythm Abdomen: soft, non-tender; no masses,  no organomegaly Extremities: extremities normal, atraumatic, no cyanosis or edema Skin: Skin color, texture, turgor normal. No rashes or lesions Lymph nodes: Cervical, supraclavicular, and axillary nodes normal. No abnormal inguinal nodes palpated Neurologic: Grossly normal   Pelvic: External genitalia:  no lesions              Urethra:  normal appearing urethra with no masses, tenderness or lesions              Bartholin's and Skene's: normal                 Vagina: normal appearing vagina with normal color and discharge, no lesions              Cervix: anteverted  Pap taken: No. Bimanual Exam:  Uterus:  normal size, contour, position, consistency, mobility, non-tender              Adnexa: no mass, fullness, tenderness               Rectovaginal: Confirms               Anus:  normal sphincter tone, no lesions  A:  Well Woman with normal exam  Postmenopausal HRT 4/09 - fall 2009  History of PMB 08/2007 with negative endo biopsy  H/O HSV- antivirals initially; off now  History of MVP  Weight gain    P:   Reviewed health and wellness pertinent to exam  Pap smear not taken today  Mammogram is due 3/16 and will place order for BMD  Will get routine labs - decided against the cortisol level after checking with Dr. Quincy Simmonds as the information would not be helpful in regards to her weight management.  May need a nutritionist for this and a  trainer that can help her with muscle toning.   She will be given this info with her other test results.  She has no signs of Cushing's syndrome that would need a cortisol level.  Counseled on breast self exam, adequate intake of calcium and vitamin D, diet and exercise return annually or prn  An After Visit Summary was printed and given to the patient.

## 2013-08-23 NOTE — Patient Instructions (Signed)

## 2013-08-24 LAB — COMPREHENSIVE METABOLIC PANEL
ALBUMIN: 4.4 g/dL (ref 3.5–5.2)
ALT: 14 U/L (ref 0–35)
AST: 16 U/L (ref 0–37)
Alkaline Phosphatase: 69 U/L (ref 39–117)
BUN: 20 mg/dL (ref 6–23)
CALCIUM: 9.2 mg/dL (ref 8.4–10.5)
CHLORIDE: 107 meq/L (ref 96–112)
CO2: 23 mEq/L (ref 19–32)
Creat: 0.87 mg/dL (ref 0.50–1.10)
GLUCOSE: 95 mg/dL (ref 70–99)
Potassium: 4.7 mEq/L (ref 3.5–5.3)
Sodium: 142 mEq/L (ref 135–145)
Total Bilirubin: 0.5 mg/dL (ref 0.2–1.2)
Total Protein: 6.7 g/dL (ref 6.0–8.3)

## 2013-08-24 LAB — TSH: TSH: 0.909 u[IU]/mL (ref 0.350–4.500)

## 2013-08-24 LAB — LIPID PANEL
Cholesterol: 226 mg/dL — ABNORMAL HIGH (ref 0–200)
HDL: 67 mg/dL
LDL Cholesterol: 141 mg/dL — ABNORMAL HIGH (ref 0–99)
Total CHOL/HDL Ratio: 3.4 ratio
Triglycerides: 92 mg/dL
VLDL: 18 mg/dL (ref 0–40)

## 2013-08-24 LAB — CORTISOL: Cortisol, Plasma: 10.5 ug/dL

## 2013-08-24 LAB — VITAMIN D 25 HYDROXY (VIT D DEFICIENCY, FRACTURES): Vit D, 25-Hydroxy: 41 ng/mL (ref 30–89)

## 2013-08-29 NOTE — Progress Notes (Signed)
Encounter reviewed by Dr. Rodriguez Aguinaldo Silva.  

## 2013-09-23 ENCOUNTER — Telehealth: Payer: Self-pay | Admitting: Nurse Practitioner

## 2013-09-23 NOTE — Telephone Encounter (Addendum)
Called patient. She is concerned because mychart is telling her she is due for pap smear and she recently had annual exam. Advised patient that her last pap smear was done prior to our electronic medical record but that we do have prior pap results. Also, advised that pap smear guidelines have changed and does not require annual pap smear. Per Milford Cage, FNPSame partner for 7 years. Pap: 07/29/11, WNL, neg HR HPV. Patient verbalized understanding. Will call back prn.  Updated last pap smear in health maintenance.  Routing to provider for final review. Patient agreeable to disposition. Will close encounter

## 2013-09-23 NOTE — Telephone Encounter (Signed)
Patient is asking if she had a pap smear done at her last appointment. I told her no, she thought she did have it done.

## 2013-10-29 ENCOUNTER — Other Ambulatory Visit: Payer: Self-pay

## 2013-11-10 ENCOUNTER — Ambulatory Visit (INDEPENDENT_AMBULATORY_CARE_PROVIDER_SITE_OTHER): Payer: BC Managed Care – PPO | Admitting: Internal Medicine

## 2013-11-10 ENCOUNTER — Encounter: Payer: Self-pay | Admitting: Internal Medicine

## 2013-11-10 VITALS — BP 136/80 | HR 62 | Ht 64.0 in | Wt 156.4 lb

## 2013-11-10 DIAGNOSIS — R918 Other nonspecific abnormal finding of lung field: Secondary | ICD-10-CM

## 2013-11-10 DIAGNOSIS — J3089 Other allergic rhinitis: Secondary | ICD-10-CM

## 2013-11-10 NOTE — Patient Instructions (Addendum)
Best choice for dripping nose and tickle is zyrtec with other option  For drainage take chlortrimeton (chlorpheniramine) 4 mg every 4 hours available over the counter (may cause drowsiness)   CT chest in one year ( and also of sinuses if still coughing )   If you have trouble with breathing/ wheezing and need albuterol more than a few times a week then you would need something strong for your asthma I would recommend singulair first.   If not effective then will need to see me or your allergist   GERD (REFLUX)  is an extremely common cause of respiratory symptoms, many times with no significant heartburn at all.    It can be treated with medication, but also with lifestyle changes including avoidance of late meals, excessive alcohol, smoking cessation, and avoid fatty foods, chocolate, peppermint, colas, red wine, and acidic juices such as orange juice.  NO MINT OR MENTHOL PRODUCTS SO NO COUGH DROPS  USE SUGARLESS CANDY INSTEAD (jolley ranchers or Stover's or life saver)  NO OIL BASED VITAMINS - use powdered substitutes.

## 2013-11-10 NOTE — Progress Notes (Signed)
   Subjective:    Patient ID: Shannon Dickson, female    DOB: 1950/07/20  MRN: [id number]  HPI  44 yowf never smoker grew up around a lot of cig smoke adopted a cat 2012 and w/in a month started having issues with nasal congestion and eyes itching and established allergic to cats verified by [facility]  but did not agree to shots then onset of cough x sept 2015 refractory to rx so Cxr done/ nl then CT chest = abn > referred to pulmonary clinic 10/281/5 by Dr Lynnae Prude    11/10/2013 1st [facility] office visit/ Shannon Dickson   Chief Complaint  Patient presents with  . Pulmonary Consult    Referred by Dr. Lynnae Prude for eval of abnormal CT Chest. Pt states she had bronchitis recently. She states that she "has always had SOB through the yrs".  She c/o prod cough with clear to white sputum.   prednisone helped one week prior to OV  Reduce nasal symptoms and cough  Cat goes in bed  [facility] brand non sedating antihistamine usually  works fine  No ongoing sob or need for saba though has helped in past     Review of Systems  Constitutional: Negative for fever, chills and unexpected weight change.  HENT: Positive for congestion and sneezing. Negative for dental problem, ear pain, nosebleeds, postnasal drip, rhinorrhea, sinus pressure, sore throat, trouble swallowing and voice change.   Eyes: Negative for visual disturbance.  Respiratory: Positive for cough and shortness of breath. Negative for choking.   Cardiovascular: Negative for chest pain and leg swelling.  Gastrointestinal: Negative for vomiting, abdominal pain and diarrhea.  Genitourinary: Negative for difficulty urinating.  Musculoskeletal: Negative for arthralgias.  Skin: Negative for rash.  Neurological: Negative for tremors, syncope and headaches.  Hematological: Does not bruise/bleed easily.       Objective:   Physical Exam  amb wf nad freq throat clearing   Wt Readings from Last 3 Encounters:  11/10/13 156 lb 6.4 oz  (70.943 kg)  08/23/13 152 lb (68.947 kg)  10/27/12 152 lb (68.947 kg)      HEENT: nl dentition, turbinates, and orophanx. Nl external ear canals without cough reflex   NECK :  without JVD/Nodes/TM/ nl carotid upstrokes bilaterally   LUNGS: no acc muscle use, clear to A and P bilaterally without cough on insp or exp maneuvers   CV:  RRR  no s3 or murmur or increase in P2, no edema   ABD:  soft and nontender with nl excursion in the supine position. No bruits or organomegaly, bowel sounds nl  MS:  warm without deformities, calf tenderness, cyanosis or clubbing  SKIN: warm and dry without lesions    NEURO:  alert, approp, no deficits    CT [facility] 11/04/13 RUL nodule and lingular gg nodule     Assessment & Plan:

## 2013-11-11 DIAGNOSIS — J309 Allergic rhinitis, unspecified: Secondary | ICD-10-CM | POA: Insufficient documentation

## 2013-11-11 DIAGNOSIS — R918 Other nonspecific abnormal finding of lung field: Secondary | ICD-10-CM | POA: Insufficient documentation

## 2013-11-11 NOTE — Assessment & Plan Note (Signed)
See CT [facility] 11/04/13 RUL/ Lingula/ never smoker  Her acute symptoms have nothing to do with these nodules and are more likely allergic rhinitis/ intermittent asthma (see sep a/p)  Although there are clearly abnormalities on CT scan, they should probably be considered "microscopic" since not obvious on plain cxr .    Discussed in detail all the  indications, usual  risks and alternatives  relative to the benefits with patient who agrees to proceed with repeat non contrast ct in one year per [facility] guidelines for incidental very small nodules in asymptomatic settings.

## 2013-11-11 NOTE — Assessment & Plan Note (Signed)
Previous evaluation by [facility]/ refused shots   Reviewed options for treatment with ? Complication of mild intermittent asthma and rule of 2s for saba use  Main focus should be avoidance since does not want to take meds or shots   Allergy /pulmoary f/u prn at her discretion

## 2013-11-22 ENCOUNTER — Telehealth: Payer: Self-pay | Admitting: Nurse Practitioner

## 2013-11-22 NOTE — Telephone Encounter (Signed)
Pt has a question for the nurse. Pt wants too know if a pap smear will show if you have ovarian cancer.

## 2013-11-22 NOTE — Telephone Encounter (Signed)
Spoke with patient. Patient states that she read an article about ovarian cancer and bloating was mentioned. "I am have been more bloated recently and I am concerned because I did not have a pap smear this year." Advised patient a pap smear would not show [place] if she has ovarian cancer. Advised there are other forms of testing for ovarian cancer. Patient would like to know what other symptoms of ovarian cancer are. Advised patient bloating, weight loss, feeling full quicker, abdominal discomfort and nausea are a couple of symptoms. "Oh I am just having bloating but nothing else. I eat fine and I have been gaining weight. I think it may be due to not exercising and getting older." Advised patient if she has any concerns at all I would be happy to make an appointment to get her in to be seen with Milford Cage, FNP for evaluation. "I think I was just worrying for no reason." Advised patient if she changes her mind and would like to be seen to give our office a call so we can get her in. Patient is agreeable.  Routing to provider for final review. Patient agreeable to disposition. Will close encounter

## 2013-11-22 NOTE — Telephone Encounter (Signed)
Message left to return call to Sal Spratley at [phone].    

## 2013-11-29 ENCOUNTER — Telehealth: Payer: Self-pay | Admitting: Internal Medicine

## 2013-11-29 NOTE — Telephone Encounter (Signed)
Ov with all meds in hand me or Tammy NP- nothing else to suggest over the phone

## 2013-11-29 NOTE — Telephone Encounter (Signed)
Pt scheduled for OV with TP 11/30/13 at 2pm Pt aware to bring all her meds with her to her appt--aware that this is very important she brings these.  Pt rushed off the phone ending phone conversation before I could confirm that she understood when her appt was and that she needs to bring meds. Pt seemed very pre-occupied.  Nothing further needed.

## 2013-11-29 NOTE — Telephone Encounter (Signed)
Pt c/o increased productive cough with yellow tinged mucus-thick at times and runny nose/PND.  Reports the nasal mucus is clear.  Requesting something for cough suppression. Pt taking [facility] brand allergy relief(outdoor/indoor) and [facility] chest congestion relief PE--- little relief [facility]  Allergies  Allergen Reactions  . Penicillins     Not Pure but can have it mixed with other things   Please advise Dr Melvyn Novas. Thanks.

## 2013-11-30 ENCOUNTER — Encounter: Payer: Self-pay | Admitting: Adult Health

## 2013-11-30 ENCOUNTER — Ambulatory Visit (INDEPENDENT_AMBULATORY_CARE_PROVIDER_SITE_OTHER): Payer: BC Managed Care – PPO | Admitting: Adult Health

## 2013-11-30 ENCOUNTER — Ambulatory Visit: Payer: BC Managed Care – PPO | Admitting: Adult Health

## 2013-11-30 VITALS — BP 116/74 | HR 61 | Temp 97.1°F | Ht 64.0 in | Wt 155.2 lb

## 2013-11-30 DIAGNOSIS — R059 Cough, unspecified: Secondary | ICD-10-CM | POA: Insufficient documentation

## 2013-11-30 DIAGNOSIS — R05 Cough: Secondary | ICD-10-CM

## 2013-11-30 NOTE — Assessment & Plan Note (Signed)
Cyclical cough with Rhinitis trigger   Plan Begin Delsym 2 tsp Twice daily  For cough As needed   Begin Allegra 180mg  daily  Add Chlortrimeton 4mg  every 4hrs as needed for throat clearing, drainage . , may make you sleepy.  Continue on Montelukast 10mg  .daily .  Continue on Fluticasone 1 puffs Twice daily   Use sips of water to help soothe throat , use sugarless candy  NO MINTS.  Follow up Dr. Melvyn Novas  In 6 weeks and As needed   Please contact office for sooner follow up if symptoms do not improve or worsen or seek emergency care

## 2013-11-30 NOTE — Progress Notes (Signed)
   Subjective:    Patient ID: Shannon Dickson, female    DOB: 10-Nov-1950  MRN: [id number]  HPI  33 yowf never smoker grew up around a lot of cig smoke adopted a cat 2012 and w/in a month started having issues with nasal congestion and eyes itching and established allergic to cats verified by [facility]  but did not agree to shots then onset of cough x sept 2015 refractory to rx so Cxr done/ nl then CT chest = abn > referred to pulmonary clinic 10/281/5 by Dr Lynnae Prude    11/10/2013 1st [facility] office visit/ [place]   Chief Complaint  Patient presents with  . Pulmonary Consult    Referred by Dr. Lynnae Prude for eval of abnormal CT Chest. Pt states she had bronchitis recently. She states that she "has always had SOB through the yrs".  She c/o prod cough with clear to white sputum.   prednisone helped one week prior to OV  Reduce nasal symptoms and cough  Cat goes in bed  [facility]vs brand non sedating antihistamine usually  works fine  No ongoing sob or need for saba though has helped in past  >zyrtec and chlortrimeton   11/30/2013 Acute OV  Returns for persistent wheezing, prod cough with clear/light yellow mucus, PND for last 6 weeks Complains of cough throughout the day and night. Mainly is dry with occasional clear to light yellow mucus. Treated with prednisone and abx initially got some better but never went away.  Recommended last ov for chrortrimeton but did not get this.  Denies any increased SOB, tightness, head congestion, f/c/s, n/v/d, hemoptysis.   Review of Systems  Constitutional: Negative for fever, chills and unexpected weight change.  HENT: Positive for congestion and sneezing. Negative for dental problem, ear pain, nosebleeds,+postnasal drip, rhinorrhea, sinus pressure, sore throat, trouble swallowing and voice change.   Eyes: Negative for visual disturbance.  Respiratory: Positive for cough and shortness of breath. Negative for choking.   Cardiovascular: Negative  for chest pain and leg swelling.  Gastrointestinal: Negative for vomiting, abdominal pain and diarrhea.  Genitourinary: Negative for difficulty urinating.  Musculoskeletal: Negative for arthralgias.  Skin: Negative for rash.  Neurological: Negative for tremors, syncope and headaches.  Hematological: Does not bruise/bleed easily.       Objective:   Physical Exam  amb wf nad freq throat clearing     HEENT: nl dentition, turbinates, and orophanx. Nl external ear canals without cough reflex   NECK :  without JVD/Nodes/TM/ nl carotid upstrokes bilaterally   LUNGS: no acc muscle use, clear to A and P bilaterally without cough on insp or exp maneuvers   CV:  RRR  no s3 or murmur or increase in P2, no edema   ABD:  soft and nontender with nl excursion in the supine position. No bruits or organomegaly, bowel sounds nl  MS:  warm without deformities, calf tenderness, cyanosis or clubbing  SKIN: warm and dry without lesions    NEURO:  alert, approp, no deficits    CT [facility] 11/04/13 RUL nodule and lingular gg nodule     Assessment & Plan:

## 2013-11-30 NOTE — Patient Instructions (Signed)
Begin Delsym 2 tsp Twice daily  For cough As needed   Begin Allegra 180mg  daily  Add Chlortrimeton 4mg  every 4hrs as needed for throat clearing, drainage . , may make you sleepy.  Continue on Montelukast 10mg  .daily .  Continue on Fluticasone 1 puffs Twice daily   Use sips of water to help soothe throat , use sugarless candy  NO MINTS.  Follow up Dr. Melvyn Novas  In 6 weeks and As needed   Please contact office for sooner follow up if symptoms do not improve or worsen or seek emergency care

## 2014-01-11 ENCOUNTER — Ambulatory Visit: Payer: BC Managed Care – PPO | Admitting: Internal Medicine

## 2014-04-29 ENCOUNTER — Telehealth: Payer: Self-pay | Admitting: Nurse Practitioner

## 2014-04-29 NOTE — Telephone Encounter (Signed)
LMTCB about canceled appointment °

## 2014-08-25 ENCOUNTER — Ambulatory Visit: Payer: BC Managed Care – PPO | Admitting: Nurse Practitioner

## 2014-08-26 ENCOUNTER — Ambulatory Visit: Payer: Self-pay | Admitting: Nurse Practitioner

## 2014-08-31 ENCOUNTER — Ambulatory Visit: Payer: Self-pay | Admitting: Nurse Practitioner

## 2014-10-18 ENCOUNTER — Telehealth: Payer: Self-pay | Admitting: *Deleted

## 2014-10-18 DIAGNOSIS — R918 Other nonspecific abnormal finding of lung field: Secondary | ICD-10-CM

## 2014-10-18 NOTE — Telephone Encounter (Signed)
LMTCB

## 2014-10-18 NOTE — Telephone Encounter (Signed)
-----   Message from Tanda Rockers, MD sent at 11/10/2013  4:17 PM EDT ----- Needs ct no contrast and also add a sinus CT if still coughing

## 2014-10-20 NOTE — Telephone Encounter (Signed)
Spoke with the pt  She states no longer coughing, and so does not feel she needs ct sinus  She agrees to have ct chest done f/u MPN, but wants to wait until Jan 2017 due to insurance purposes  I have sent order to [facility] for this

## 2014-11-23 ENCOUNTER — Ambulatory Visit: Payer: Self-pay | Admitting: Nurse Practitioner

## 2015-01-03 ENCOUNTER — Telehealth: Payer: Self-pay | Admitting: Internal Medicine

## 2015-01-03 NOTE — Telephone Encounter (Signed)
She's low risk so that's fine but send copy of this note to her PCP  Shannon Dickson

## 2015-01-03 NOTE — Telephone Encounter (Signed)
FYI - Order was put in for pt to have CT in January.  I called the pt to give her the appt info & she declined to get the CT.  She states she is fine now & does not feel that she needs it.  I cancelled the appt and the order.

## 2015-01-17 ENCOUNTER — Encounter: Payer: Self-pay | Admitting: *Deleted

## 2015-01-18 ENCOUNTER — Encounter: Payer: Self-pay | Admitting: Nurse Practitioner

## 2015-01-18 ENCOUNTER — Ambulatory Visit (INDEPENDENT_AMBULATORY_CARE_PROVIDER_SITE_OTHER): Payer: BLUE CROSS/BLUE SHIELD | Admitting: Nurse Practitioner

## 2015-01-18 VITALS — BP 132/76 | HR 72 | Ht 63.5 in | Wt 155.0 lb

## 2015-01-18 DIAGNOSIS — Z0000 Encounter for general adult medical examination without abnormal findings: Secondary | ICD-10-CM | POA: Diagnosis not present

## 2015-01-18 DIAGNOSIS — Z01419 Encounter for gynecological examination (general) (routine) without abnormal findings: Secondary | ICD-10-CM

## 2015-01-18 DIAGNOSIS — E2839 Other primary ovarian failure: Secondary | ICD-10-CM | POA: Diagnosis not present

## 2015-01-18 LAB — HEMOGLOBIN, FINGERSTICK: HEMOGLOBIN, FINGERSTICK: 14.5 g/dL (ref 12.0–16.0)

## 2015-01-18 LAB — VITAMIN D 25 HYDROXY (VIT D DEFICIENCY, FRACTURES): VIT D 25 HYDROXY: 26 ng/mL — AB (ref 30–100)

## 2015-01-18 LAB — COMPREHENSIVE METABOLIC PANEL
ALK PHOS: 85 U/L (ref 33–130)
ALT: 31 U/L — AB (ref 6–29)
AST: 28 U/L (ref 10–35)
Albumin: 4.3 g/dL (ref 3.6–5.1)
BILIRUBIN TOTAL: 0.4 mg/dL (ref 0.2–1.2)
BUN: 15 mg/dL (ref 7–25)
CO2: 28 mmol/L (ref 20–31)
CREATININE: 0.79 mg/dL (ref 0.50–0.99)
Calcium: 9.4 mg/dL (ref 8.6–10.4)
Chloride: 104 mmol/L (ref 98–110)
GLUCOSE: 86 mg/dL (ref 65–99)
Potassium: 4.3 mmol/L (ref 3.5–5.3)
SODIUM: 140 mmol/L (ref 135–146)
TOTAL PROTEIN: 6.8 g/dL (ref 6.1–8.1)

## 2015-01-18 LAB — POCT URINALYSIS DIPSTICK
BILIRUBIN UA: NEGATIVE
Glucose, UA: NEGATIVE
Ketones, UA: NEGATIVE
LEUKOCYTES UA: NEGATIVE
NITRITE UA: NEGATIVE
PH UA: 6
Protein, UA: NEGATIVE
RBC UA: NEGATIVE
UROBILINOGEN UA: NEGATIVE

## 2015-01-18 LAB — LIPID PANEL
CHOLESTEROL: 224 mg/dL — AB (ref 125–200)
HDL: 63 mg/dL (ref >=46)
LDL Cholesterol: 129 mg/dL (ref <130)
Total CHOL/HDL Ratio: 3.6 Ratio (ref <=5.0)
Triglycerides: 162 mg/dL — ABNORMAL HIGH (ref <150)
VLDL: 32 mg/dL — ABNORMAL HIGH (ref <30)

## 2015-01-18 LAB — HIV ANTIBODY (ROUTINE TESTING W REFLEX): HIV: NONREACTIVE

## 2015-01-18 LAB — HEPATITIS C ANTIBODY: HCV AB: NEGATIVE

## 2015-01-18 LAB — TSH: TSH: 1.38 u[IU]/mL (ref 0.350–4.500)

## 2015-01-18 NOTE — Patient Instructions (Signed)

## 2015-01-18 NOTE — Progress Notes (Signed)
Patient ID: Shannon Dickson, female   DOB: 1950/03/05, 65 y.o.   MRN: [id number]  65 y.o. G0P0 Significant Other Caucasian Fe here for annual exam. No vaso symptoms, maybe mild flushes during the day.  No vaginal dryness.  No change in partner of 9 years.   Patient's last menstrual period was 08/21/2007 (exact date).          Sexually active: Yes.    The current method of family planning is post menopausal status.    Exercising: Yes.    tennis, bike ride and gym routine Smoker:  no  Health Maintenance: Pap: 07/29/11, Negative with neg HR HPV MMG:05/04/14, 3D, Bi-Rads 1: Negative  Colonoscopy: 10/16/11, hyperplastic polyp, repeat in 5 years BMD: 2008 - heel screen with [employer] and was normal TDaP: 04/24/10 Shingles: Never Pneumonia: Not indicated due to age Hep C and HIV: done today Labs: HB: 14.5   Urine: Negative    reports that she has never smoked. She has never used smokeless tobacco. She reports that she drinks alcohol. She reports that she does not use illicit drugs.  Past Medical History  Diagnosis Date  . SOB (shortness of breath) 01/2012    negative cardio, ENT, and pulmonary evaluation  . MVP (mitral valve prolapse)     about 30 yrs ago  . HSV infection     Past Surgical History  Procedure Laterality Date  . Knee surgery Left 2008    arthroscopy  . Shoulder surgery Right 12/09       . Knee arthroscopy Right 3/10  . Breast biopsy Right 01/23/10    fibroadenoma w/microcalcifications  . Tubal ligation  2010    Current Outpatient Prescriptions  Medication Sig Dispense Refill  . fexofenadine (ALLEGRA) 180 MG tablet Take 180 mg by mouth daily.    Marland Kitchen Phenylephrine-Guaifenesin 10-400 MG TABS Per bottle as needed     No current facility-administered medications for this visit.    Family History  Problem Relation Age of Onset  . Asthma Maternal Aunt   . Diabetes Mother   . Diabetes Sister     deceased    ROS:  Pertinent items are noted in HPI.  Otherwise, a  comprehensive ROS was negative.  Exam:   BP 132/76 mmHg  Pulse 72  Ht 5' 3.5" (1.613 m)  Wt 155 lb (70.308 kg)  BMI 27.02 kg/m2  LMP 08/21/2007 (Exact Date) Height: 5' 3.5" (161.3 cm) Ht Readings from Last 3 Encounters:  01/18/15 5' 3.5" (1.613 m)  11/30/13 5\' 4"  (1.626 m)  11/10/13 5\' 4"  (1.626 m)    General appearance: alert, cooperative and appears stated age Head: Normocephalic, without obvious abnormality, atraumatic Neck: no adenopathy, supple, symmetrical, trachea midline and thyroid normal to inspection and palpation Lungs: clear to auscultation bilaterally Breasts: normal appearance, no masses or tenderness Heart: regular rate and rhythm Abdomen: soft, non-tender; no masses,  no organomegaly Extremities: extremities normal, atraumatic, no cyanosis or edema Skin: Skin color, texture, turgor normal. No rashes or lesions Lymph nodes: Cervical, supraclavicular, and axillary nodes normal. No abnormal inguinal nodes palpated Neurologic: Grossly normal   Pelvic: External genitalia:  no lesions              Urethra:  normal appearing urethra with no masses, tenderness or lesions              Bartholin's and Skene's: normal                 Vagina: normal appearing vagina  with normal color and discharge, no lesions              Cervix: anteverted              Pap taken: Yes.   Bimanual Exam:  Uterus:  normal size, contour, position, consistency, mobility, non-tender              Adnexa: no mass, fullness, tenderness               Rectovaginal: Confirms               Anus:  normal sphincter tone, no lesions  Chaperone present: no  A:  Well Woman with normal exam  Postmenopausal HRT 4/09 - fall 2009 History of PMB 08/2007 with negative endo biopsy H/O HSV- antivirals initially; off now History of MVP Weight gain   P:   Reviewed health and wellness pertinent to exam  Pap smear as above  Mammogram is due 04/2015, will also  get BMD - order is placed  Will follow with fasting labs  Encouraged to restart exercise at this time   Counseled on breast self exam, mammography screening, adequate intake of calcium and vitamin D, diet and exercise, Kegel's exercises return annually or prn  An After Visit Summary was printed and given to the patient.

## 2015-01-20 ENCOUNTER — Other Ambulatory Visit: Payer: Self-pay

## 2015-01-21 NOTE — Progress Notes (Signed)
Encounter reviewed by Dr. Tekeya Geffert Amundson C. Silva.  

## 2015-01-23 LAB — IPS PAP TEST WITH HPV

## 2015-01-25 ENCOUNTER — Telehealth: Payer: Self-pay | Admitting: Nurse Practitioner

## 2015-01-25 ENCOUNTER — Other Ambulatory Visit: Payer: Self-pay | Admitting: Nurse Practitioner

## 2015-01-25 MED ORDER — BENZONATATE 100 MG PO CAPS: 200.0000 mg | ORAL_CAPSULE | Freq: Three times a day (TID) | ORAL | Status: DC | PRN

## 2015-01-25 NOTE — Telephone Encounter (Signed)
Spoke with patient. Patient states that she has a "moisture cough, itchy eyes and a runny nose." Is requesting Kem Boroughs, FNP send in a prescription for her cough. Advised this is something we are unable to do as a gynecology practice. Patient states "Shannon Dickson is my GP. I never see anyone else. I have been seeing her for 20 years. Can you just ask her?" Denies any fevers or chills. Advised I will speak with Kem Boroughs, FNP and return call with further recommendations. Patient is agreeable.

## 2015-01-25 NOTE — Telephone Encounter (Signed)
Since I just saw her a week ago, I am willing to give her cough med's with Tessalon Pearls.  Not our usual practice to treat this.  But if they are here we do tend to treat and if not better or worsens needs to be seen at Urgent care.

## 2015-01-25 NOTE — Telephone Encounter (Signed)
Patient c/o of a moisture cough and is wondering if Ms. Patty could send something in for her. I explained to patient that we are a gynecology practice and this isn't something that we typically treat. Patient states "I only see Patty I don't have a GP."  Best # to reach: [phone]

## 2015-01-26 NOTE — Telephone Encounter (Signed)
Spoke with patient. Advised of message as seen below from Patricia Grubb, FNP. Patient is agreeable and verbalizes understanding.  Routing to provider for final review. Patient agreeable to disposition. Will close encounter.  

## 2015-01-26 NOTE — Telephone Encounter (Signed)
Left message to call Deryn Massengale at [phone]. 

## 2015-11-22 ENCOUNTER — Encounter (INDEPENDENT_AMBULATORY_CARE_PROVIDER_SITE_OTHER): Payer: BLUE CROSS/BLUE SHIELD | Admitting: Ophthalmology

## 2015-11-22 DIAGNOSIS — H43813 Vitreous degeneration, bilateral: Secondary | ICD-10-CM

## 2015-11-22 DIAGNOSIS — H5319 Other subjective visual disturbances: Secondary | ICD-10-CM | POA: Diagnosis not present

## 2015-11-22 DIAGNOSIS — H2513 Age-related nuclear cataract, bilateral: Secondary | ICD-10-CM

## 2016-02-08 ENCOUNTER — Encounter: Payer: Self-pay | Admitting: Nurse Practitioner

## 2016-02-08 NOTE — Progress Notes (Signed)
Patient ID: Shannon Dickson, female   DOB: 1950-10-30, 66 y.o.   MRN: [id number]  65 y.o. G0P0000 Significant Other Caucasian Fe here for annual exam.  No new health problems. She still has some bronchitis every year X 4.  Minimal vaso symptoms.  Same partner x 11 yrs.  Some urinary frequency.  Patient's last menstrual period was 08/21/2007 (exact date).          Sexually active: Yes.    The current method of family planning is post menopausal status and tubal ligation. Same partner x 10 years.   Exercising: Yes.    walking, bike riding and going to the gym. Smoker:  no  Health Maintenance: Pap:01/18/15, Negative with neg HR HPV MMG:05/18/15, Bi-Rads 2: Benign  Colonoscopy: 10/16/11, hyperplastic polyp, repeat in 5 years BMD:2008 - heel screen with [employer] and was normal TDaP: 04/23/10 Shingles: Never Hep C and HIV: 01/18/15 Labs: HB: 15.3   Urine: negative   reports that she has never smoked. She has never used smokeless tobacco. She reports that she drinks alcohol. She reports that she does not use drugs.  Past Medical History:  Diagnosis Date  . HSV infection   . MVP (mitral valve prolapse)    about 30 yrs ago  . SOB (shortness of breath) 01/2012   negative cardio, ENT, and pulmonary evaluation    Past Surgical History:  Procedure Laterality Date  . BREAST BIOPSY Right 01/23/10   fibroadenoma w/microcalcifications  . KNEE ARTHROSCOPY Right 3/10  . KNEE SURGERY Left 2008   arthroscopy  . SHOULDER SURGERY Right 12/09      . TUBAL LIGATION  2010    No current outpatient prescriptions on file.   No current facility-administered medications for this visit.     Family History  Problem Relation Age of Onset  . Diabetes Mother   . Diabetes Sister     deceased  . Asthma Maternal Aunt     ROS:  Pertinent items are noted in HPI.  Otherwise, a comprehensive ROS was negative.  Exam:   BP 124/82 (BP Location: Right Arm, Patient Position: Sitting, Cuff Size: Large)   Pulse 60    Ht 5' 3.5" (1.613 m)   Wt 156 lb (70.8 kg)   LMP 08/21/2007 (Exact Date)   BMI 27.20 kg/m  Height: 5' 3.5" (161.3 cm) Ht Readings from Last 3 Encounters:  02/09/16 5' 3.5" (1.613 m)  01/18/15 5' 3.5" (1.613 m)  11/30/13 5\' 4"  (1.626 m)    General appearance: alert, cooperative and appears stated age Head: Normocephalic, without obvious abnormality, atraumatic Neck: no adenopathy, supple, symmetrical, trachea midline and thyroid normal to inspection and palpation Lungs: clear to auscultation bilaterally Breasts: normal appearance, no masses or tenderness Heart: regular rate and rhythm Abdomen: soft, non-tender; no masses,  no organomegaly Extremities: extremities normal, atraumatic, no cyanosis or edema Skin: Skin color, texture, turgor normal. No rashes or lesions Lymph nodes: Cervical, supraclavicular, and axillary nodes normal. No abnormal inguinal nodes palpated Neurologic: Grossly normal   Pelvic: External genitalia:  no lesions              Urethra:  normal appearing urethra with no masses, tenderness or lesions              Bartholin's and Skene's: normal                 Vagina: normal appearing vagina with normal color and discharge, no lesions  Cervix: anteverted              Pap taken: No. Bimanual Exam:  Uterus:  normal size, contour, position, consistency, mobility, non-tender              Adnexa: no mass, fullness, tenderness               Rectovaginal: Confirms               Anus:  normal sphincter tone, no lesions  Chaperone present: yes  A:  Well Woman with normal exam      Postmenopausal HRT 4/09 - fall 2009 History of PMB 08/2007 with negative endo biopsy H/O HSV- antivirals initially; off now History of MVP  Urinary frequency -R/O UTI   P:   Reviewed health and wellness pertinent to exam  Pap smear as above  Mammogram is due 05/2016  Will call to get colonoscopy scheduled for October   Follow  with labs and urine  Counseled on breast self exam, mammography screening, adequate intake of calcium and vitamin D, diet and exercise, Kegel's exercises return annually or prn  An After Visit Summary was printed and given to the patient.

## 2016-02-09 ENCOUNTER — Ambulatory Visit (INDEPENDENT_AMBULATORY_CARE_PROVIDER_SITE_OTHER): Payer: BLUE CROSS/BLUE SHIELD | Admitting: Nurse Practitioner

## 2016-02-09 ENCOUNTER — Encounter: Payer: Self-pay | Admitting: Nurse Practitioner

## 2016-02-09 VITALS — BP 124/82 | HR 60 | Ht 63.5 in | Wt 156.0 lb

## 2016-02-09 DIAGNOSIS — R350 Frequency of micturition: Secondary | ICD-10-CM

## 2016-02-09 DIAGNOSIS — Z860100 Personal history of colon polyps, unspecified: Secondary | ICD-10-CM

## 2016-02-09 DIAGNOSIS — Z01419 Encounter for gynecological examination (general) (routine) without abnormal findings: Secondary | ICD-10-CM

## 2016-02-09 DIAGNOSIS — I341 Nonrheumatic mitral (valve) prolapse: Secondary | ICD-10-CM

## 2016-02-09 DIAGNOSIS — Z0000 Encounter for general adult medical examination without abnormal findings: Secondary | ICD-10-CM | POA: Diagnosis not present

## 2016-02-09 LAB — TSH: TSH: 0.75 m[IU]/L

## 2016-02-09 LAB — POCT URINALYSIS DIPSTICK
Bilirubin, UA: NEGATIVE
GLUCOSE UA: NEGATIVE
Ketones, UA: NEGATIVE
LEUKOCYTES UA: NEGATIVE
NITRITE UA: NEGATIVE
Protein, UA: NEGATIVE
RBC UA: NEGATIVE
UROBILINOGEN UA: NEGATIVE
pH, UA: 6

## 2016-02-09 LAB — CBC
HCT: 45.3 % — ABNORMAL HIGH (ref 35.0–45.0)
Hemoglobin: 15.1 g/dL (ref 11.7–15.5)
MCH: 28.2 pg (ref 27.0–33.0)
MCHC: 33.3 g/dL (ref 32.0–36.0)
MCV: 84.7 fL (ref 80.0–100.0)
MPV: 8.5 fL (ref 7.5–12.5)
PLATELETS: 276 10*3/uL (ref 140–400)
RBC: 5.35 MIL/uL — AB (ref 3.80–5.10)
RDW: 14.5 % (ref 11.0–15.0)
WBC: 5.6 10*3/uL (ref 3.8–10.8)

## 2016-02-09 LAB — HEMOGLOBIN, FINGERSTICK: Hemoglobin, fingerstick: 15.3 g/dL — ABNORMAL HIGH (ref 12.0–15.0)

## 2016-02-09 NOTE — Patient Instructions (Signed)

## 2016-02-10 LAB — COMPREHENSIVE METABOLIC PANEL
ALK PHOS: 79 U/L (ref 33–130)
ALT: 15 U/L (ref 6–29)
AST: 17 U/L (ref 10–35)
Albumin: 4.1 g/dL (ref 3.6–5.1)
BILIRUBIN TOTAL: 0.5 mg/dL (ref 0.2–1.2)
BUN: 16 mg/dL (ref 7–25)
CALCIUM: 9.3 mg/dL (ref 8.6–10.4)
CO2: 22 mmol/L (ref 20–31)
Chloride: 105 mmol/L (ref 98–110)
Creat: 0.82 mg/dL (ref 0.50–0.99)
GLUCOSE: 97 mg/dL (ref 65–99)
Potassium: 4.3 mmol/L (ref 3.5–5.3)
Sodium: 140 mmol/L (ref 135–146)
Total Protein: 6.7 g/dL (ref 6.1–8.1)

## 2016-02-10 LAB — LIPID PANEL
CHOL/HDL RATIO: 3.4 ratio (ref <5.0)
CHOLESTEROL: 239 mg/dL — AB (ref <200)
HDL: 71 mg/dL (ref >50)
LDL Cholesterol: 143 mg/dL — ABNORMAL HIGH (ref <100)
TRIGLYCERIDES: 124 mg/dL (ref <150)
VLDL: 25 mg/dL (ref <30)

## 2016-02-10 LAB — URINALYSIS, MICROSCOPIC ONLY
BACTERIA UA: NONE SEEN [HPF]
Casts: NONE SEEN [LPF]
Crystals: NONE SEEN [HPF]
RBC / HPF: NONE SEEN RBC/HPF (ref <=2)
SQUAMOUS EPITHELIAL / LPF: NONE SEEN [HPF] (ref <=5)
WBC UA: NONE SEEN WBC/HPF (ref <=5)
YEAST: NONE SEEN [HPF]

## 2016-02-10 LAB — HEMOGLOBIN A1C
Hgb A1c MFr Bld: 5.3 % (ref <5.7)
Mean Plasma Glucose: 105 mg/dL

## 2016-02-10 LAB — VITAMIN D 25 HYDROXY (VIT D DEFICIENCY, FRACTURES): Vit D, 25-Hydroxy: 32 ng/mL (ref 30–100)

## 2016-02-11 LAB — URINE CULTURE: Organism ID, Bacteria: NO GROWTH

## 2016-02-11 NOTE — Progress Notes (Signed)
Encounter reviewed by Dr. Dillon Mcreynolds Amundson C. Silva.  

## 2016-03-25 DIAGNOSIS — H43813 Vitreous degeneration, bilateral: Secondary | ICD-10-CM | POA: Diagnosis not present

## 2016-03-25 DIAGNOSIS — H2513 Age-related nuclear cataract, bilateral: Secondary | ICD-10-CM | POA: Diagnosis not present

## 2016-03-25 DIAGNOSIS — Z880 Allergy status to penicillin: Secondary | ICD-10-CM | POA: Diagnosis not present

## 2016-03-25 DIAGNOSIS — Z7722 Contact with and (suspected) exposure to environmental tobacco smoke (acute) (chronic): Secondary | ICD-10-CM | POA: Diagnosis not present

## 2016-03-25 DIAGNOSIS — H538 Other visual disturbances: Secondary | ICD-10-CM | POA: Diagnosis not present

## 2016-04-03 ENCOUNTER — Other Ambulatory Visit: Payer: Self-pay | Admitting: Family Medicine

## 2016-04-03 DIAGNOSIS — H539 Unspecified visual disturbance: Secondary | ICD-10-CM

## 2016-04-10 ENCOUNTER — Telehealth: Payer: Self-pay | Admitting: Nurse Practitioner

## 2016-04-10 NOTE — Telephone Encounter (Signed)
Spoke with patient. Patient states Shannon Boroughs, NP Should have received some information regarding referrals that are needed for echo and ultrasound of heart and neck. Patient states she was seen by specialist March 25, 2016 for episodes of grayness she was seeing, for eyes, Dr. Zigmund Daniel at [facility]. Patient states she assumed she was good to go and [place] placed referral since appointments were scheduled. Patient states she has appt with pcp in May. Patient states appointments were scheduled for echo and ultrasound but now will be cancelled d/t not being seen by pcp first for referral. Patient states she needs this evaluation and can't wait, needs referral from Shannon Boroughs, NP as one of her appointments is for 04/15/16. Advised patient can reach out to pcp to move appointment up for evaluation, patient declined. Patient states Shannon Boroughs, NP should be able to place referral, ask her about the paper work? Advised patient would review with Shannon Boroughs, NP and return call.    Shannon Boroughs, NP -please review and advise?

## 2016-04-10 NOTE — Telephone Encounter (Addendum)
Reviewed with provider -patient needs to follow-up with pcp for referral. Can assist with scheduling with pcp for earlier appointment.   Left detailed message on [phone], ok per current dpr. Advised patient of recommendations as seen above per Kem Boroughs, NP. Return call to office if assistance needed for scheduling with pcp or additional questions.  Routing to provider for final review. Patient is agreeable to disposition. Will close encounter.

## 2016-04-10 NOTE — Telephone Encounter (Signed)
Patient wants to speak with the nurse about getting an ultrasound and echo order. She states she needs a referral from [facility] to get this done.

## 2016-04-11 ENCOUNTER — Telehealth: Payer: Self-pay | Admitting: Nurse Practitioner

## 2016-04-11 DIAGNOSIS — R1084 Generalized abdominal pain: Secondary | ICD-10-CM | POA: Diagnosis not present

## 2016-04-11 NOTE — Telephone Encounter (Signed)
Patient said she is waiting to hear from back from a nurse regarding conversation yesterday. No open telephone note?

## 2016-04-11 NOTE — Telephone Encounter (Signed)
Spoke with patient, advised of detailed message left on 04/10/16 -see telephone encounter. Reviewed recommendations as seen below per Kem Boroughs, NP. Patient states she will call pcp to see if appointment can be moved up. Patient verbalizes understanding and is agreeable.  Routing to provider for final review. Patient is agreeable to disposition. Will close encounter.  Cc: Kem Boroughs, NP      You contacted Verl Dicker. [place]  Me   04/10/16 2:51 PM  Note    Reviewed with provider -patient needs to follow-up with pcp for referral. Can assist with scheduling with pcp for earlier appointment.   Left detailed message on [phone], ok per current dpr. Advised patient of recommendations as seen above per Kem Boroughs, NP. Return call to office if assistance needed for scheduling with pcp or additional questions.  Routing to provider for final review. Patient is agreeable to disposition. Will close encounter.

## 2016-04-15 ENCOUNTER — Other Ambulatory Visit: Payer: Self-pay

## 2016-04-17 DIAGNOSIS — H539 Unspecified visual disturbance: Secondary | ICD-10-CM | POA: Diagnosis not present

## 2016-04-19 DIAGNOSIS — H539 Unspecified visual disturbance: Secondary | ICD-10-CM | POA: Diagnosis not present

## 2016-04-19 DIAGNOSIS — H538 Other visual disturbances: Secondary | ICD-10-CM | POA: Diagnosis not present

## 2016-04-29 ENCOUNTER — Ambulatory Visit
Admission: RE | Admit: 2016-04-29 | Discharge: 2016-04-29 | Disposition: A | Discharge status: Home / Self Care | Payer: Medicare HMO | Source: Ambulatory Visit | Attending: Family Medicine | Admitting: Family Medicine

## 2016-04-29 DIAGNOSIS — I6523 Occlusion and stenosis of bilateral carotid arteries: Secondary | ICD-10-CM | POA: Diagnosis not present

## 2016-04-29 DIAGNOSIS — H539 Unspecified visual disturbance: Secondary | ICD-10-CM

## 2016-05-02 ENCOUNTER — Other Ambulatory Visit: Payer: Self-pay | Admitting: Physician Assistant

## 2016-05-02 DIAGNOSIS — E041 Nontoxic single thyroid nodule: Secondary | ICD-10-CM

## 2016-05-13 ENCOUNTER — Ambulatory Visit
Admission: RE | Admit: 2016-05-13 | Discharge: 2016-05-13 | Disposition: A | Discharge status: Home / Self Care | Payer: Medicare HMO | Source: Ambulatory Visit | Attending: Physician Assistant | Admitting: Physician Assistant

## 2016-05-13 DIAGNOSIS — E042 Nontoxic multinodular goiter: Secondary | ICD-10-CM | POA: Diagnosis not present

## 2016-05-13 DIAGNOSIS — E041 Nontoxic single thyroid nodule: Secondary | ICD-10-CM

## 2016-05-31 DIAGNOSIS — G458 Other transient cerebral ischemic attacks and related syndromes: Secondary | ICD-10-CM | POA: Diagnosis not present

## 2016-08-09 DIAGNOSIS — E785 Hyperlipidemia, unspecified: Secondary | ICD-10-CM | POA: Diagnosis not present

## 2016-08-09 DIAGNOSIS — N3281 Overactive bladder: Secondary | ICD-10-CM | POA: Diagnosis not present

## 2016-08-09 DIAGNOSIS — Z0000 Encounter for general adult medical examination without abnormal findings: Secondary | ICD-10-CM | POA: Diagnosis not present

## 2016-08-19 DIAGNOSIS — Z1231 Encounter for screening mammogram for malignant neoplasm of breast: Secondary | ICD-10-CM | POA: Diagnosis not present

## 2016-08-23 ENCOUNTER — Telehealth: Payer: Self-pay | Admitting: Obstetrics and Gynecology

## 2016-08-23 NOTE — Telephone Encounter (Signed)
Left message regarding upcoming appointment has been canceled and needs to be rescheduled. °

## 2016-10-08 ENCOUNTER — Encounter: Payer: Self-pay | Admitting: Obstetrics and Gynecology

## 2016-10-08 DIAGNOSIS — Z1211 Encounter for screening for malignant neoplasm of colon: Secondary | ICD-10-CM | POA: Diagnosis not present

## 2016-10-08 DIAGNOSIS — K5730 Diverticulosis of large intestine without perforation or abscess without bleeding: Secondary | ICD-10-CM | POA: Diagnosis not present

## 2016-10-08 DIAGNOSIS — Z86010 Personal history of colonic polyps: Secondary | ICD-10-CM | POA: Diagnosis not present

## 2016-10-11 DIAGNOSIS — E785 Hyperlipidemia, unspecified: Secondary | ICD-10-CM | POA: Diagnosis not present

## 2016-11-20 DIAGNOSIS — K635 Polyp of colon: Secondary | ICD-10-CM | POA: Diagnosis not present

## 2016-11-20 DIAGNOSIS — Z1211 Encounter for screening for malignant neoplasm of colon: Secondary | ICD-10-CM | POA: Diagnosis not present

## 2016-11-20 DIAGNOSIS — K5730 Diverticulosis of large intestine without perforation or abscess without bleeding: Secondary | ICD-10-CM | POA: Diagnosis not present

## 2016-11-20 DIAGNOSIS — D125 Benign neoplasm of sigmoid colon: Secondary | ICD-10-CM | POA: Diagnosis not present

## 2016-11-20 DIAGNOSIS — Z86010 Personal history of colonic polyps: Secondary | ICD-10-CM | POA: Diagnosis not present

## 2016-12-20 DIAGNOSIS — J018 Other acute sinusitis: Secondary | ICD-10-CM | POA: Diagnosis not present

## 2016-12-20 DIAGNOSIS — J209 Acute bronchitis, unspecified: Secondary | ICD-10-CM | POA: Diagnosis not present

## 2016-12-20 DIAGNOSIS — J0100 Acute maxillary sinusitis, unspecified: Secondary | ICD-10-CM | POA: Diagnosis not present

## 2017-02-03 DIAGNOSIS — J019 Acute sinusitis, unspecified: Secondary | ICD-10-CM | POA: Diagnosis not present

## 2017-02-09 DIAGNOSIS — J209 Acute bronchitis, unspecified: Secondary | ICD-10-CM | POA: Diagnosis not present

## 2017-02-09 DIAGNOSIS — R05 Cough: Secondary | ICD-10-CM | POA: Diagnosis not present

## 2017-02-12 ENCOUNTER — Other Ambulatory Visit: Payer: Self-pay

## 2017-02-12 ENCOUNTER — Ambulatory Visit (INDEPENDENT_AMBULATORY_CARE_PROVIDER_SITE_OTHER): Payer: Medicare HMO | Admitting: Certified Nurse Midwife

## 2017-02-12 ENCOUNTER — Encounter: Payer: Self-pay | Admitting: Certified Nurse Midwife

## 2017-02-12 ENCOUNTER — Ambulatory Visit: Payer: BLUE CROSS/BLUE SHIELD | Admitting: Nurse Practitioner

## 2017-02-12 VITALS — BP 118/70 | HR 68 | Resp 16 | Ht 63.25 in | Wt 155.0 lb

## 2017-02-12 DIAGNOSIS — Z01419 Encounter for gynecological examination (general) (routine) without abnormal findings: Secondary | ICD-10-CM

## 2017-02-12 DIAGNOSIS — N951 Menopausal and female climacteric states: Secondary | ICD-10-CM | POA: Diagnosis not present

## 2017-02-12 DIAGNOSIS — Z8709 Personal history of other diseases of the respiratory system: Secondary | ICD-10-CM

## 2017-02-12 NOTE — Patient Instructions (Signed)

## 2017-02-12 NOTE — Progress Notes (Signed)
67 y.o. G0P0000  Divorced  Caucasian Fe here for annual exam. Menopausal no HRT. Denies vaginal bleeding or vaginal dryness. Patient was seen by [facility] for URI versus Bronchitis with medication with some change.States it always starts in fall and usually better by now.Still wheezing and coughing. Sees PCP yearly for labs and Lipitor/urinary urgency management. All medications stable. No other health issues today.     Patient's last menstrual period was 08/21/2007 (exact date).          Sexually active: Yes.    The current method of family planning is tubal ligation.    Exercising: Yes.    gym, tennis, bike Smoker:  no  Health Maintenance: Pap:  01-18-15 neg HPV HR neg History of Abnormal Pap: no MMG:  08-19-16 category c density birads 1:neg Self Breast exams: yes Colonoscopy:  2018 f/u polyps 75yrs BMD:   Heel screen few yrs ago TDaP:  2012 Shingles: no Pneumonia: no Hep C and HIV: both neg 2017 Labs: no   reports that  has never smoked. she has never used smokeless tobacco. She reports that she drinks alcohol. She reports that she does not use drugs.  Past Medical History:  Diagnosis Date  . HSV infection   . MVP (mitral valve prolapse)    about 30 yrs ago  . SOB (shortness of breath) 01/2012   negative cardio, ENT, and pulmonary evaluation    Past Surgical History:  Procedure Laterality Date  . BREAST BIOPSY Right 01/23/10   fibroadenoma w/microcalcifications  . KNEE ARTHROSCOPY Right 3/10  . KNEE SURGERY Left 2008   arthroscopy  . SHOULDER SURGERY Right 12/09      . TUBAL LIGATION  2010    Current Outpatient Medications  Medication Sig Dispense Refill  . atorvastatin (LIPITOR) 10 MG tablet     . oxybutynin (DITROPAN) 5 MG tablet Take 5 mg by mouth 3 (three) times daily.     No current facility-administered medications for this visit.     Family History  Problem Relation Age of Onset  . Diabetes Mother   . Diabetes Sister        deceased  . Asthma Maternal  Aunt     ROS:  Pertinent items are noted in HPI.  Otherwise, a comprehensive ROS was negative.  Exam:   BP 118/70   Pulse 68   Resp 16   Ht 5' 3.25" (1.607 m)   Wt 155 lb (70.3 kg)   LMP 08/21/2007 (Exact Date)   BMI 27.24 kg/m  Height: 5' 3.25" (160.7 cm) Ht Readings from Last 3 Encounters:  02/12/17 5' 3.25" (1.607 m)  02/09/16 5' 3.5" (1.613 m)  01/18/15 5' 3.5" (1.613 m)    General appearance: alert, cooperative and appears stated age Head: Normocephalic, without obvious abnormality, atraumatic Neck: no adenopathy, supple, symmetrical, trachea midline and thyroid normal to inspection and palpation Lungs: Rales heard on right and occasional wheeze heard bilateral Breasts: normal appearance, no masses or tenderness, No nipple retraction or dimpling, No nipple discharge or bleeding, No axillary or supraclavicular adenopathy Heart: regular rate and rhythm Abdomen: soft, non-tender; no masses,  no organomegaly Extremities: extremities normal, atraumatic, no cyanosis or edema Skin: Skin color, texture, turgor normal. No rashes or lesions Lymph nodes: Cervical, supraclavicular, and axillary nodes normal. No abnormal inguinal nodes palpated Neurologic: Grossly normal   Pelvic: External genitalia:  no lesions              Urethra:  normal appearing urethra with  no masses, tenderness or lesions              Bartholin's and Skene's: normal                 Vagina: normal appearing vagina with normal color and discharge, no lesions              Cervix: no cervical motion tenderness, no lesions and normal appearance              Pap taken: No. Bimanual Exam:  Uterus:  normal size, contour, position, consistency, mobility, non-tender              Adnexa: normal adnexa and no mass, fullness, tenderness               Rectovaginal: Confirms               Anus:  normal sphincter tone, no lesions  Chaperone present: yes  A:  Well Woman with normal exam  Menopausal no HRT  History of  URI still symptomatic  MD management of urinary frequency/cholesterol  BMD due  P:   Reviewed health and wellness pertinent to exam  Aware of need to evaluate if vaginal bleeding  Discussed lung congestion noted on exam and needs to follow up with PCP, non Urgent care now. Patient will call today and schedule.  Continue follow up with MD regarding other health issues  Discussed BMD due and can schedule with next mammogram, patient will do this and call if issues  Pap smear: no   counseled on breast self exam, mammography screening, feminine hygiene, menopause, osteoporosis, adequate intake of calcium and vitamin D, diet and exercise  return annually or prn  An After Visit Summary was printed and given to the patient.

## 2017-02-17 DIAGNOSIS — E559 Vitamin D deficiency, unspecified: Secondary | ICD-10-CM | POA: Diagnosis not present

## 2017-02-17 DIAGNOSIS — R05 Cough: Secondary | ICD-10-CM | POA: Diagnosis not present

## 2017-02-17 DIAGNOSIS — E785 Hyperlipidemia, unspecified: Secondary | ICD-10-CM | POA: Diagnosis not present

## 2017-03-07 DIAGNOSIS — R05 Cough: Secondary | ICD-10-CM | POA: Diagnosis not present

## 2017-04-07 DIAGNOSIS — E785 Hyperlipidemia, unspecified: Secondary | ICD-10-CM | POA: Diagnosis not present

## 2017-04-07 DIAGNOSIS — E559 Vitamin D deficiency, unspecified: Secondary | ICD-10-CM | POA: Diagnosis not present

## 2017-04-07 DIAGNOSIS — K1370 Unspecified lesions of oral mucosa: Secondary | ICD-10-CM | POA: Diagnosis not present

## 2017-05-17 DIAGNOSIS — J209 Acute bronchitis, unspecified: Secondary | ICD-10-CM | POA: Diagnosis not present

## 2017-05-17 DIAGNOSIS — J0100 Acute maxillary sinusitis, unspecified: Secondary | ICD-10-CM | POA: Diagnosis not present

## 2017-06-02 DIAGNOSIS — H5213 Myopia, bilateral: Secondary | ICD-10-CM | POA: Diagnosis not present

## 2017-06-05 ENCOUNTER — Ambulatory Visit: Payer: Medicare HMO | Admitting: Internal Medicine

## 2017-06-05 ENCOUNTER — Encounter: Payer: Self-pay | Admitting: Internal Medicine

## 2017-06-05 ENCOUNTER — Ambulatory Visit (INDEPENDENT_AMBULATORY_CARE_PROVIDER_SITE_OTHER)
Admission: RE | Admit: 2017-06-05 | Discharge: 2017-06-05 | Disposition: A | Discharge status: Home / Self Care | Payer: Medicare HMO | Source: Ambulatory Visit | Attending: Internal Medicine | Admitting: Internal Medicine

## 2017-06-05 ENCOUNTER — Other Ambulatory Visit (INDEPENDENT_AMBULATORY_CARE_PROVIDER_SITE_OTHER): Payer: Medicare HMO

## 2017-06-05 VITALS — BP 140/70 | HR 87 | Ht 63.25 in | Wt 157.0 lb

## 2017-06-05 DIAGNOSIS — J45991 Cough variant asthma: Secondary | ICD-10-CM

## 2017-06-05 DIAGNOSIS — R0982 Postnasal drip: Secondary | ICD-10-CM

## 2017-06-05 DIAGNOSIS — R05 Cough: Secondary | ICD-10-CM | POA: Diagnosis not present

## 2017-06-05 LAB — CBC WITH DIFFERENTIAL/PLATELET
Basophils Absolute: 0 10*3/uL (ref 0.0–0.1)
Basophils Relative: 0.3 % (ref 0.0–3.0)
EOS PCT: 4 % (ref 0.0–5.0)
Eosinophils Absolute: 0.3 10*3/uL (ref 0.0–0.7)
HCT: 41.3 % (ref 36.0–46.0)
Hemoglobin: 13.9 g/dL (ref 12.0–15.0)
Lymphocytes Relative: 29 % (ref 12.0–46.0)
Lymphs Abs: 1.9 10*3/uL (ref 0.7–4.0)
MCHC: 33.6 g/dL (ref 30.0–36.0)
MCV: 80.9 fl (ref 78.0–100.0)
MONOS PCT: 7 % (ref 3.0–12.0)
Monocytes Absolute: 0.5 10*3/uL (ref 0.1–1.0)
NEUTROS PCT: 59.7 % (ref 43.0–77.0)
Neutro Abs: 3.9 10*3/uL (ref 1.4–7.7)
Platelets: 278 10*3/uL (ref 150.0–400.0)
RBC: 5.1 Mil/uL (ref 3.87–5.11)
RDW: 13.6 % (ref 11.5–15.5)
WBC: 6.5 10*3/uL (ref 4.0–10.5)

## 2017-06-05 MED ORDER — PREDNISONE 10 MG PO TABS: ORAL_TABLET | ORAL | 0 refills | Status: DC

## 2017-06-05 MED ORDER — FLUTICASONE FUROATE 100 MCG/ACT IN AEPB: 1.0000 | INHALATION_SPRAY | Freq: Every day | RESPIRATORY_TRACT | 5 refills | Status: DC

## 2017-06-05 NOTE — Patient Instructions (Addendum)
ICD-10-CM   1. Cough variant asthma J45.991   2. Post-nasal drip R09.82     I think above is what you have  Plan - cxr 2 view 06/05/2017 - do cbc with diff and - do blood IgE level 06/05/2017 - continue but make sure you ar regular with  take generic fluticasone inhaler 2 squirts each nostril daily - start inhaled arnuity low dose daily scheduled  - Take prednisone 40 mg daily x 2 days, then 20mg  daily x 2 days, then 10mg  daily x 2 days, then 5mg  daily x 2 days and stop   Followup 4-6 weeks with APP to report progress

## 2017-06-05 NOTE — Progress Notes (Signed)
   Subjective:    Patient ID: Sherene Sires, female    DOB: 1950/10/07, 67 y.o.   MRN: [id number]  HPI    Review of Systems  Constitutional: Negative for fever and unexpected weight change.  HENT: Positive for postnasal drip. Negative for congestion, dental problem, ear pain, nosebleeds, rhinorrhea, sinus pressure, sneezing, sore throat and trouble swallowing.   Eyes: Negative for redness and itching.  Respiratory: Positive for cough. Negative for chest tightness, shortness of breath and wheezing.   Cardiovascular: Negative for palpitations and leg swelling.  Gastrointestinal: Negative for nausea and vomiting.  Genitourinary: Negative for dysuria.  Musculoskeletal: Negative for joint swelling.  Skin: Negative for rash.  Allergic/Immunologic: Negative.  Negative for environmental allergies, food allergies and immunocompromised state.  Neurological: Positive for headaches.  Hematological: Does not bruise/bleed easily.  Psychiatric/Behavioral: Negative for dysphoric mood. The patient is not nervous/anxious.        Objective:   Physical Exam        Assessment & Plan:

## 2017-06-05 NOTE — Progress Notes (Signed)
Subjective:     Patient ID: Shannon Dickson, female   DOB: [birth year], 67 y.o.   MRN: [id number]  PCP Kem Boroughs, FNP  HPI  IOV 06/05/2017  Chief Complaint  Patient presents with  . Consult    Referred by Maude Leriche, PA due to a cough.  Pt was last seen at the office by TP 11/30/13. Pt states she has had problems with the cough since 11/2016. Pt has coughed up yellow mucus and states cough is worse in the mornings.    Shannon Dickson 67 y.o. female [address] [address] -originally moved to [place] in 1992 to work for every [employer].  She moved from [place].  She used to see our practice for years ago for chronic cough but she says after that it resolved.  Now since September 2018 she has had chronic cough associated with wheezing of moderate intensity.  There is variable pattern to this.  Occasionally she is woken up at night.  Definitely associated with wheezing intermittently.  There is no sputum production or shortness of breath or chest tightness.  The significant nasal drainage.  She has had relief with prednisone in the past couple of times last course was in February 2019.  She has been seen at [facility] where they gave nasal steroid and also antihistamine which is given her some relief.  There is no history of any ACE inhibitor intake or acid reflux or beta-blocker intake.  In the remote past several years ago she is been allergy skin testing was positive but she does not remember any details.  She does have cats in the house.  Chest x-ray April 2014 that I personally visualized is clear  Review of the CT chest report from October 2015 done at the outside hospital: In October 2015 she was ruled out for pulmonary embolism but the right upper lobe had groundglass opacity infiltrate with a 3 mm nodule and a 5 mm groundglass opacity in the lingula  Results for JERICHO, CIESLIK "SHARLETTE JANSMA" (MRN [id number]) as of 06/05/2017 15:56  Ref. Range 12/22/2007 15:05  04/25/2010 13:09 04/26/2010 03:33  Eosinophils Absolute Latest Ref Range: 0.0 - 0.7 K/uL 0.2 0.1 0.3     has a past medical history of HSV infection, MVP (mitral valve prolapse), and SOB (shortness of breath) (01/2012).   reports that she has never smoked. She has never used smokeless tobacco.  Past Surgical History:  Procedure Laterality Date  . BREAST BIOPSY Right 01/23/10   fibroadenoma w/microcalcifications  . KNEE ARTHROSCOPY Right 3/10  . KNEE SURGERY Left 2008   arthroscopy  . SHOULDER SURGERY Right 12/09      . TUBAL LIGATION  2010    Allergies  Allergen Reactions  . Penicillins     Not Pure but can have it mixed with other things    Immunization History  Administered Date(s) Administered  . Tdap 04/23/2010    Family History  Problem Relation Age of Onset  . Diabetes Mother   . Diabetes Sister        deceased  . Asthma Maternal Aunt      Current Outpatient Medications:  .  atorvastatin (LIPITOR) 10 MG tablet, , Disp: , Rfl:  .  benzonatate (TESSALON) 100 MG capsule, TAKE 1 CAPSULE BY MOUTH THREE TIMES A DAY *DO NOT BREAK CHEW, DISSOLVE, CUT, OR CRUSH*, Disp: , Rfl: 0 .  cetirizine (ZYRTEC) 10 MG tablet, Take 10 mg by mouth daily., Disp: ,  Rfl: 0 .  fluticasone (FLONASE) 50 MCG/ACT nasal spray, SPRAY 1 SPRAY INTO EACH NOSTRIL TWICE A DAY, Disp: , Rfl: 0 .  MUCINEX 600 MG 12 hr tablet, PLEASE SEE ATTACHED FOR DETAILED DIRECTIONS, Disp: , Rfl: 0 .  oxybutynin (DITROPAN) 5 MG tablet, Take 5 mg by mouth 3 (three) times daily., Disp: , Rfl:     Review of Systems     Objective:   Physical Exam  Constitutional: She is oriented to person, place, and time. She appears well-developed and well-nourished. No distress.  HENT:  Head: Normocephalic and atraumatic.  Right Ear: External ear normal.  Left Ear: External ear normal.  Mouth/Throat: Oropharynx is clear and moist. No oropharyngeal exudate.  Nasal twang  Eyes: Pupils are equal, round, and reactive to light.  Conjunctivae and EOM are normal. Right eye exhibits no discharge. Left eye exhibits no discharge. No scleral icterus.  Neck: Normal range of motion. Neck supple. No JVD present. No tracheal deviation present. No thyromegaly present.  Cardiovascular: Normal rate, regular rhythm, normal heart sounds and intact distal pulses. Exam reveals no gallop and no friction rub.  No murmur heard. Pulmonary/Chest: Effort normal and breath sounds normal. No respiratory distress. She has no wheezes. She has no rales. She exhibits no tenderness.  Abdominal: Soft. Bowel sounds are normal. She exhibits no distension and no mass. There is no tenderness. There is no rebound and no guarding.  Musculoskeletal: Normal range of motion. She exhibits no edema or tenderness.  Lymphadenopathy:    She has no cervical adenopathy.  Neurological: She is alert and oriented to person, place, and time. She has normal reflexes. No cranial nerve deficit. She exhibits normal muscle tone. Coordination normal.  Skin: Skin is warm and dry. No rash noted. She is not diaphoretic. No erythema. No pallor.  Psychiatric: She has a normal mood and affect. Her behavior is normal. Judgment and thought content normal.  Vitals reviewed.  Vitals:   06/05/17 1549  BP: 140/70  Pulse: 87  SpO2: 94%  Weight: 157 lb (71.2 kg)  Height: 5' 3.25" (1.607 m)    Estimated body mass index is 27.59 kg/m as calculated from the following:   Height as of this encounter: 5' 3.25" (1.607 m).   Weight as of this encounter: 157 lb (71.2 kg).       Assessment:       ICD-10-CM   1. Cough variant asthma J45.991   2. Post-nasal drip R09.82        Plan:      I think above is what you have  Plan - cxr 2 view 06/05/2017 - do cbc with diff and - do blood IgE level 06/05/2017 - continue but make sure you ar regular with  take generic fluticasone inhaler 2 squirts each nostril daily - start inhaled arnuity low dose daily scheduled  - Take prednisone 40  mg daily x 2 days, then 20mg  daily x 2 days, then 10mg  daily x 2 days, then 5mg  daily x 2 days and stop   Followup 4-6 weeks with APP to report progress    Dr. Brand Males, M.D., [facility].C.P Pulmonary and Critical Care Medicine Staff Physician, [place] Director - Interstitial Lung Disease  Program  Pulmonary [place] [facility] [place], [place], [address]  Pager: [phone], If no answer or between  15:00h - 7:00h: call [phone] Telephone: [phone]

## 2017-06-06 ENCOUNTER — Telehealth: Payer: Self-pay | Admitting: Internal Medicine

## 2017-06-06 LAB — IGE: IgE (Immunoglobulin E), Serum: 75 kU/L (ref <=114)

## 2017-06-06 NOTE — Telephone Encounter (Signed)
cxr normal CBC - normal with blood eos just above threshold   Plan Continue same plan  From office visit    PULMONARY No results for input(s): PHART, PCO2ART, PO2ART, HCO3, TCO2, O2SAT in the last 168 hours.  Invalid input(s): PCO2, PO2  CBC Recent Labs  Lab 06/05/17 1628  HGB 13.9  HCT 41.3  WBC 6.5  PLT 278.0    COAGULATION No results for input(s): INR in the last 168 hours.  CARDIAC  No results for input(s): TROPONINI in the last 168 hours. No results for input(s): PROBNP in the last 168 hours.   CHEMISTRY No results for input(s): NA, K, CL, CO2, GLUCOSE, BUN, CREATININE, CALCIUM, MG, PHOS in the last 168 hours. CrCl cannot be calculated (Patient's most recent lab result is older than the maximum 21 days allowed.).   LIVER No results for input(s): AST, ALT, ALKPHOS, BILITOT, PROT, ALBUMIN, INR in the last 168 hours.   INFECTIOUS No results for input(s): LATICACIDVEN, PROCALCITON in the last 168 hours.   ENDOCRINE CBG (last 3)  No results for input(s): GLUCAP in the last 72 hours.       IMAGING x48h  - image(s) personally visualized  -   highlighted in bold Dg Chest 2 View  Result Date: 06/06/2017 CLINICAL DATA:  Cough variant asthma EXAM: CHEST - 2 VIEW COMPARISON:  04/14/2012 FINDINGS: The heart size and mediastinal contours are within normal limits. Both lungs are clear. The visualized skeletal structures are unremarkable. IMPRESSION: No active cardiopulmonary disease. Electronically Signed   By: Franchot Gallo M.D.   On: 06/06/2017 08:42

## 2017-06-11 NOTE — Telephone Encounter (Signed)
Patient returned phone call ; pt contac# [phone]

## 2017-06-11 NOTE — Telephone Encounter (Signed)
Attempted to call pt back but unable to reach pt.  Left message for pt to return call x1.

## 2017-06-11 NOTE — Telephone Encounter (Signed)
Well she can see how it goes without arnuity but my ideal recommendation is that she takes it for few months to prevent anyt cough coming back.

## 2017-06-11 NOTE — Telephone Encounter (Signed)
Pt returned call.  Stated to pt the results of her cxr and labwork and for her to continue with recs discussed at last office visit.  Pt stated to me she has not been using the arnuity that was given to her at last OV and is wanting to know if she really needs to use it.  Pt states [place] the beach and has been feeling fine since last OV and due to this is wanting to know if the inhaler is really necessary.  Pt does have a f/u OV with SG 6/20.  MR, please advise on this for pt as she really does not want to use the arnuity inhaler if she does not have to.

## 2017-06-11 NOTE — Telephone Encounter (Signed)
lmtcb for pt.  

## 2017-06-11 NOTE — Telephone Encounter (Signed)
Left voice mail on machine for patient to return phone call back regarding results. X2 

## 2017-07-03 ENCOUNTER — Ambulatory Visit: Payer: Medicare HMO | Admitting: Acute Care

## 2017-07-29 ENCOUNTER — Other Ambulatory Visit: Payer: Self-pay | Admitting: Physician Assistant

## 2017-07-29 DIAGNOSIS — E041 Nontoxic single thyroid nodule: Secondary | ICD-10-CM

## 2017-08-06 ENCOUNTER — Ambulatory Visit
Admission: RE | Admit: 2017-08-06 | Discharge: 2017-08-06 | Disposition: A | Discharge status: Home / Self Care | Payer: Medicare HMO | Source: Ambulatory Visit | Attending: Physician Assistant | Admitting: Physician Assistant

## 2017-08-06 DIAGNOSIS — E041 Nontoxic single thyroid nodule: Secondary | ICD-10-CM | POA: Diagnosis not present

## 2017-08-13 DIAGNOSIS — N3281 Overactive bladder: Secondary | ICD-10-CM | POA: Diagnosis not present

## 2017-08-13 DIAGNOSIS — Z1389 Encounter for screening for other disorder: Secondary | ICD-10-CM | POA: Diagnosis not present

## 2017-08-13 DIAGNOSIS — Z0000 Encounter for general adult medical examination without abnormal findings: Secondary | ICD-10-CM | POA: Diagnosis not present

## 2017-08-13 DIAGNOSIS — E559 Vitamin D deficiency, unspecified: Secondary | ICD-10-CM | POA: Diagnosis not present

## 2017-08-13 DIAGNOSIS — E785 Hyperlipidemia, unspecified: Secondary | ICD-10-CM | POA: Diagnosis not present

## 2017-08-20 ENCOUNTER — Encounter: Payer: Self-pay | Admitting: Certified Nurse Midwife

## 2017-08-20 DIAGNOSIS — M8589 Other specified disorders of bone density and structure, multiple sites: Secondary | ICD-10-CM | POA: Diagnosis not present

## 2017-08-20 DIAGNOSIS — Z1231 Encounter for screening mammogram for malignant neoplasm of breast: Secondary | ICD-10-CM | POA: Diagnosis not present

## 2017-08-20 DIAGNOSIS — Z780 Asymptomatic menopausal state: Secondary | ICD-10-CM | POA: Diagnosis not present

## 2017-08-21 DIAGNOSIS — Z0100 Encounter for examination of eyes and vision without abnormal findings: Secondary | ICD-10-CM | POA: Diagnosis not present

## 2017-08-28 ENCOUNTER — Encounter: Payer: Self-pay | Admitting: Certified Nurse Midwife

## 2017-09-04 ENCOUNTER — Telehealth: Payer: Self-pay

## 2017-09-04 NOTE — Telephone Encounter (Signed)
Left message for patient to call back to let her know her bone density showed some osteopenia.

## 2017-09-09 NOTE — Telephone Encounter (Signed)
Left message for call back.

## 2017-09-10 NOTE — Telephone Encounter (Signed)
Patient notified of results. See lab. I mailed calcium handout sheet to patient to help her see how much calcium she is getting in her diet.

## 2018-02-20 ENCOUNTER — Other Ambulatory Visit: Payer: Self-pay

## 2018-02-20 ENCOUNTER — Encounter: Payer: Self-pay | Admitting: Certified Nurse Midwife

## 2018-02-20 ENCOUNTER — Ambulatory Visit (INDEPENDENT_AMBULATORY_CARE_PROVIDER_SITE_OTHER): Payer: Medicare HMO | Admitting: Certified Nurse Midwife

## 2018-02-20 ENCOUNTER — Other Ambulatory Visit (HOSPITAL_COMMUNITY)
Admission: RE | Admit: 2018-02-20 | Discharge: 2018-02-20 | Disposition: A | Discharge status: Home / Self Care | Payer: Medicare HMO | Source: Ambulatory Visit | Attending: Certified Nurse Midwife | Admitting: Certified Nurse Midwife

## 2018-02-20 VITALS — BP 130/80 | HR 68 | Resp 16 | Ht 63.25 in | Wt 155.0 lb

## 2018-02-20 DIAGNOSIS — N952 Postmenopausal atrophic vaginitis: Secondary | ICD-10-CM

## 2018-02-20 DIAGNOSIS — Z01419 Encounter for gynecological examination (general) (routine) without abnormal findings: Secondary | ICD-10-CM | POA: Diagnosis not present

## 2018-02-20 DIAGNOSIS — N951 Menopausal and female climacteric states: Secondary | ICD-10-CM | POA: Diagnosis not present

## 2018-02-20 DIAGNOSIS — Z124 Encounter for screening for malignant neoplasm of cervix: Secondary | ICD-10-CM | POA: Insufficient documentation

## 2018-02-20 NOTE — Progress Notes (Signed)
68 y.o. G0P0000 Divorced  Caucasian Fe here for annual exam. Post Menopausal, denies vaginal bleeding or vaginal dryness. Feels she would like to decrease weight. She is aware that she needs to decrease snacking. Starla Link MD for aex, labs. Stopped cholesterol and Ditropan medications and feels much better. Occasional urgency and rare leakage. Feels better. Denies health issues today. No HSV outbreaks or medication update needed.  Patient's last menstrual period was 08/21/2007 (exact date).          Sexually active: Yes.    The current method of family planning is tubal ligation.    Exercising: Yes.    gym, tennis Smoker:  no  Review of Systems  Constitutional: Negative.   HENT: Negative.   Eyes: Negative.   Respiratory: Negative.   Cardiovascular: Negative.   Gastrointestinal: Negative.   Genitourinary: Negative.   Musculoskeletal: Negative.   Skin: Negative.   Neurological: Negative.   Endo/Heme/Allergies: Negative.   Psychiatric/Behavioral: Negative.     Health Maintenance: Pap:  01-18-15 neg HPV HR neg History of Abnormal Pap: no MMG:  08-20-17 category c density birads 2:neg Self Breast exams: yes Colonoscopy:  2018 polyp f/u 47yrs BMD:  2019 TDaP:  2012 Shingles: no Pneumonia: no Hep C and HIV: both neg 2017 Labs: at PCP   reports that she has never smoked. She has never used smokeless tobacco. She reports current alcohol use. She reports that she does not use drugs.  Past Medical History:  Diagnosis Date  . HSV infection   . MVP (mitral valve prolapse)    about 30 yrs ago  . SOB (shortness of breath) 01/2012   negative cardio, ENT, and pulmonary evaluation    Past Surgical History:  Procedure Laterality Date  . BREAST BIOPSY Right 01/23/10   fibroadenoma w/microcalcifications  . KNEE ARTHROSCOPY Right 3/10  . KNEE SURGERY Left 2008   arthroscopy  . SHOULDER SURGERY Right 12/09      . TUBAL LIGATION  2010    Current Outpatient Medications  Medication Sig  Dispense Refill  . atorvastatin (LIPITOR) 10 MG tablet     . benzonatate (TESSALON) 100 MG capsule TAKE 1 CAPSULE BY MOUTH THREE TIMES A DAY *DO NOT BREAK CHEW, DISSOLVE, CUT, OR CRUSH*  0  . cetirizine (ZYRTEC) 10 MG tablet Take 10 mg by mouth daily.  0  . fluticasone (FLONASE) 50 MCG/ACT nasal spray SPRAY 1 SPRAY INTO EACH NOSTRIL TWICE A DAY  0  . Fluticasone Furoate (ARNUITY ELLIPTA) 100 MCG/ACT AEPB Inhale 1 puff into the lungs daily. 30 each 5  . MUCINEX 600 MG 12 hr tablet PLEASE SEE ATTACHED FOR DETAILED DIRECTIONS  0  . oxybutynin (DITROPAN) 5 MG tablet Take 5 mg by mouth 3 (three) times daily.    . predniSONE (DELTASONE) 10 MG tablet Take 40mg x2days,20mg x2days,10mg x2days,5mg x2days,then stop 15 tablet 0   No current facility-administered medications for this visit.     Family History  Problem Relation Age of Onset  . Diabetes Mother   . Diabetes Sister        deceased  . Asthma Maternal Aunt     ROS:  Pertinent items are noted in HPI.  Otherwise, a comprehensive ROS was negative.  Exam:   LMP 08/21/2007 (Exact Date)    Ht Readings from Last 3 Encounters:  06/05/17 5' 3.25" (1.607 m)  02/12/17 5' 3.25" (1.607 m)  02/09/16 5' 3.5" (1.613 m)    General appearance: alert, cooperative and appears stated age Head: Normocephalic, without obvious abnormality,  atraumatic Neck: no adenopathy, supple, symmetrical, trachea midline and thyroid normal to inspection and palpation Lungs: clear to auscultation bilaterally Breasts: normal appearance, no masses or tenderness, No nipple retraction or dimpling, No nipple discharge or bleeding, No axillary or supraclavicular adenopathy Heart: regular rate and rhythm Abdomen: soft, non-tender; no masses,  no organomegaly Extremities: extremities normal, atraumatic, no cyanosis or edema Skin: Skin color, texture, turgor normal. No rashes or lesions Lymph nodes: Cervical, supraclavicular, and axillary nodes normal. No abnormal inguinal nodes  palpated Neurologic: Grossly normal   Pelvic: External genitalia:  no lesions normal female slight atrophy noted              Urethra:  normal appearing urethra with no masses, tenderness or lesions              Bartholin's and Skene's: normal                 Vagina: normal appearing vagina with pale color and scant discharge, no lesions, dryness noted              Cervix: no cervical motion tenderness, no lesions and normal appearance              Pap taken: Yes.   Bimanual Exam:  Uterus:  normal size, contour, position, consistency, mobility, non-tender and anteverted              Adnexa: normal adnexa and no mass, fullness, tenderness               Rectovaginal: Confirms               Anus:  normal sphincter tone, no lesions  Chaperone present: yes  A:  Well Woman with normal exam  Post menopausal  Atrophic vaginitis  PCP management of cholesterol, labs, allergies  P:   Reviewed health and wellness pertinent to exam  Discussed importance of notifying if vaginal bleeding.  Discussed atrophic vaginitis and etiology, encouraged to use OTC coconut oil or Olive Oil 2-3 times weekly for dryness for UTI and vaginal infection prevention. Questions addressed.  Continue follow up with PCP as indicated  Pap smear: yes   counseled on breast self exam, mammography screening, feminine hygiene, adequate intake of calcium and vitamin D, diet and exercise, Kegel's exercises  return annually or prn  An After Visit Summary was printed and given to the patient.

## 2018-02-20 NOTE — Patient Instructions (Signed)
EXERCISE AND DIET:  We recommended that you start or continue a regular exercise program for good health. Regular exercise means any activity that makes your heart beat faster and makes you sweat.  We recommend exercising at least 30 minutes per day at least 3 days a week, preferably 4 or 5.  We also recommend a diet low in fat and sugar.  Inactivity, poor dietary choices and obesity can cause diabetes, heart attack, stroke, and kidney damage, among others.    ALCOHOL AND SMOKING:  Women should limit their alcohol intake to no more than 7 drinks/beers/glasses of wine (combined, not each!) per week. Moderation of alcohol intake to this level decreases your risk of breast cancer and liver damage. And of course, no recreational drugs are part of a healthy lifestyle.  And absolutely no smoking or even second hand smoke. Most people know smoking can cause heart and lung diseases, but did you know it also contributes to weakening of your bones? Aging of your skin?  Yellowing of your teeth and nails?  CALCIUM AND VITAMIN D:  Adequate intake of calcium and Vitamin D are recommended.  The recommendations for exact amounts of these supplements seem to change often, but generally speaking 600 mg of calcium (either carbonate or citrate) and 800 units of Vitamin D per day seems prudent. Certain women may benefit from higher intake of Vitamin D.  If you are among these women, your doctor will have told you during your visit.    PAP SMEARS:  Pap smears, to check for cervical cancer or precancers,  have traditionally been done yearly, although recent scientific advances have shown that most women can have pap smears less often.  However, every woman still should have a physical exam from her gynecologist every year. It will include a breast check, inspection of the vulva and vagina to check for abnormal growths or skin changes, a visual exam of the cervix, and then an exam to evaluate the size and shape of the uterus and  ovaries.  And after 67 years of age, a rectal exam is indicated to check for rectal cancers. We will also provide age appropriate advice regarding health maintenance, like when you should have certain vaccines, screening for sexually transmitted diseases, bone density testing, colonoscopy, mammograms, etc.   MAMMOGRAMS:  All women over 40 years old should have a yearly mammogram. Many facilities now offer a "3D" mammogram, which may cost around $50 extra out of pocket. If possible,  we recommend you accept the option to have the 3D mammogram performed.  It both reduces the number of women who will be called back for extra views which then turn out to be normal, and it is better than the routine mammogram at detecting truly abnormal areas.    COLONOSCOPY:  Colonoscopy to screen for colon cancer is recommended for all women at age 50.  We know, you hate the idea of the prep.  We agree, BUT, having colon cancer and not knowing it is worse!!  Colon cancer so often starts as a polyp that can be seen and removed at colonscopy, which can quite literally save your life!  And if your first colonoscopy is normal and you have no family history of colon cancer, most women don't have to have it again for 10 years.  Once every ten years, you can do something that may end up saving your life, right?  We will be happy to help you get it scheduled when you are ready.    Be sure to check your insurance coverage so you understand how much it will cost.  It may be covered as a preventative service at no cost, but you should check your particular policy.   ° ° ° ° °Kegel Exercises °Kegel exercises help strengthen the muscles that support the rectum, vagina, small intestine, bladder, and uterus. Doing Kegel exercises can help: °· Improve bladder and bowel control. °· Improve sexual response. °· Reduce problems and discomfort during pregnancy. °Kegel exercises involve squeezing your pelvic floor muscles, which are the same muscles you  squeeze when you try to stop the flow of urine. The exercises can be done while sitting, standing, or lying down, but it is best to vary your position. °Exercises °1. Squeeze your pelvic floor muscles tight. You should feel a tight lift in your rectal area. If you are a female, you should also feel a tightness in your vaginal area. Keep your stomach, buttocks, and legs relaxed. °2. Hold the muscles tight for up to 10 seconds. °3. Relax your muscles. °Repeat this exercise 50 times a day or as many times as told by your health care provider. Continue to do this exercise for at least 4-6 weeks or for as long as told by your health care provider. °This information is not intended to replace advice given to you by your health care provider. Make sure you discuss any questions you have with your health care provider. °Document Released: 12/18/2011 Document Revised: 05/13/2016 Document Reviewed: 11/20/2014 °Elsevier Interactive Patient Education © 2019 [employer] ° °

## 2018-02-24 LAB — CYTOLOGY - PAP
Diagnosis: NEGATIVE
HPV: NOT DETECTED

## 2018-05-17 IMAGING — US US THYROID
1 series · 13 of 25 positions shown · non-contrast
Comparison: Carotid ultrasound 04/29/2016

CLINICAL DATA: Incidental on US. 66-year-old female with thyroid
nodules seen on recent carotid ultrasound.

EXAM:
THYROID ULTRASOUND
TECHNIQUE: Ultrasound examination of the thyroid gland and adjacent soft
tissues was performed.

[Series 1: us thyroid · 0.07mm/px · 13 of 45 slices shown]
[im 1/45]
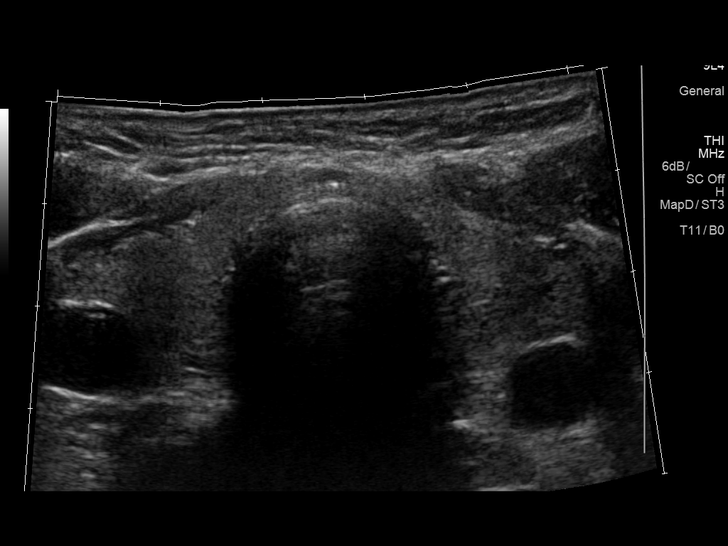
[im 4/45]
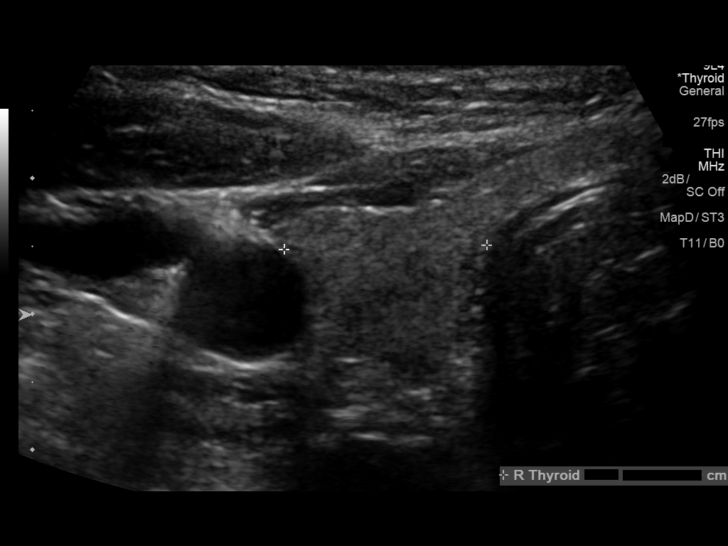
[im 8/45]
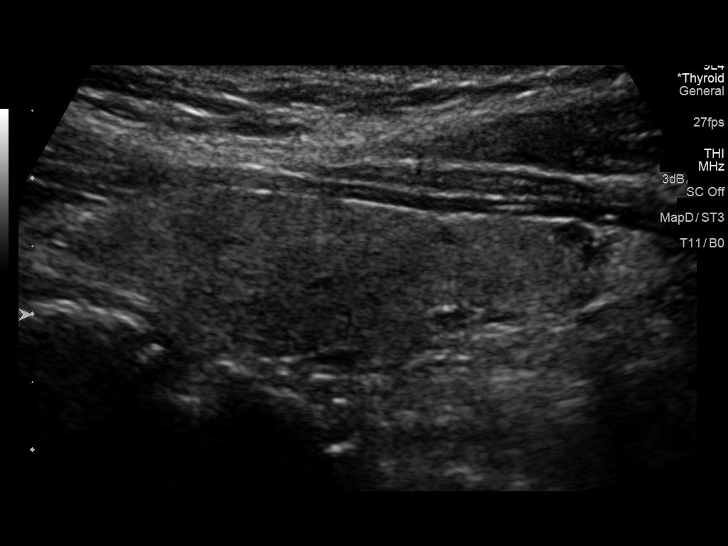
[im 12/45]
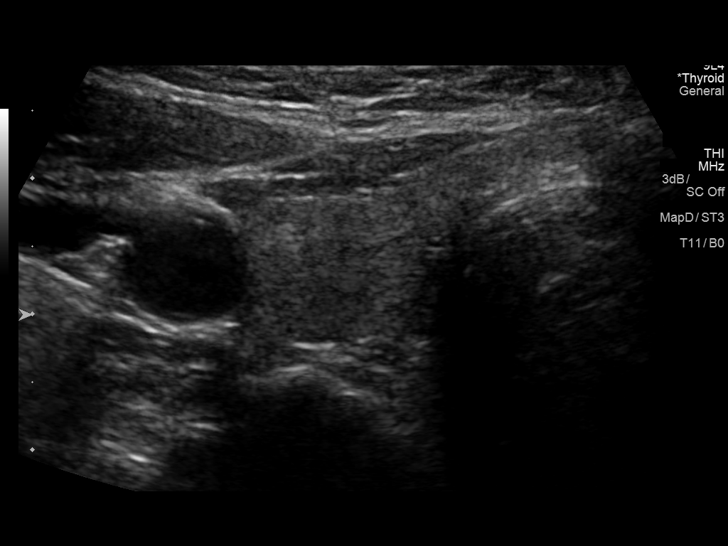
[im 15/45]
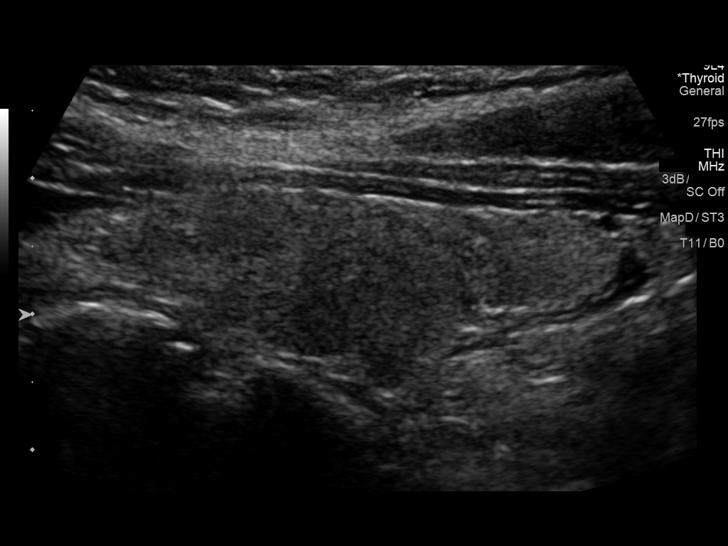
[im 19/45]
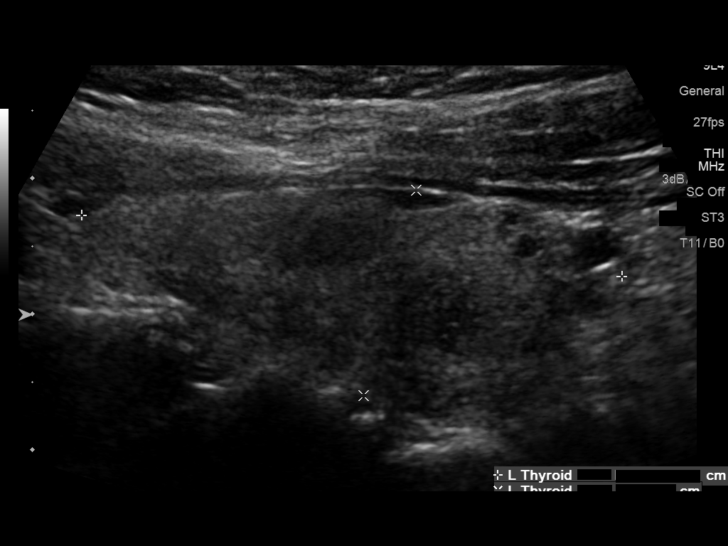
[im 23/45]
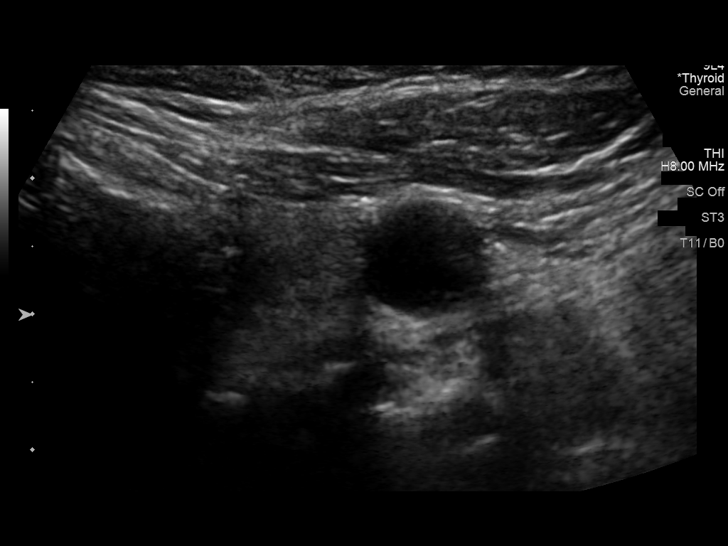
[im 26/45]
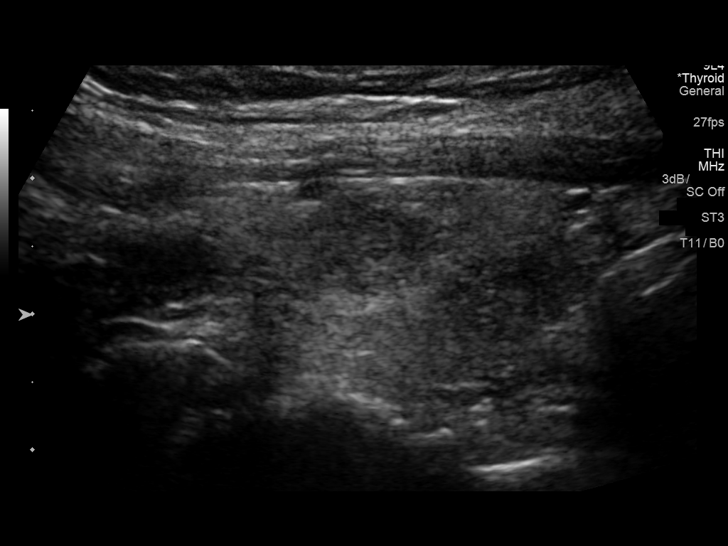
[im 30/45]
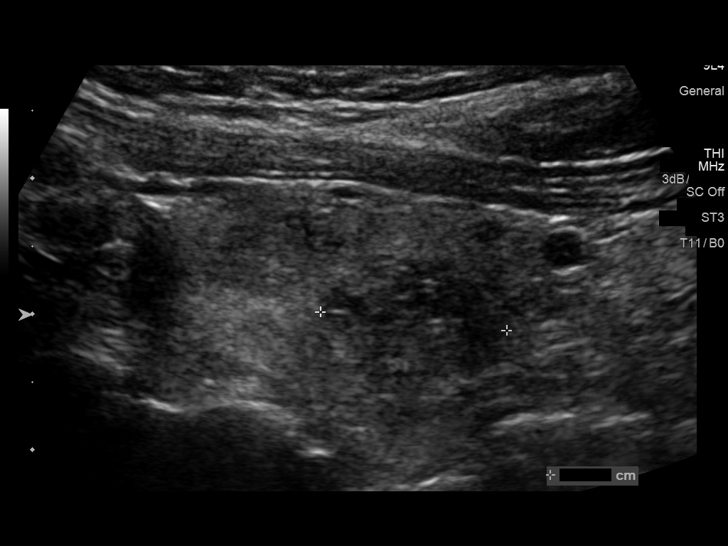
[im 34/45]
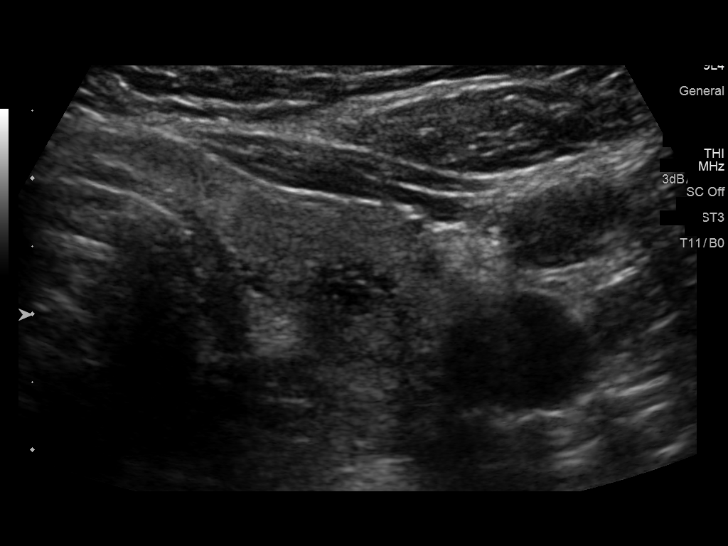
[im 37/45]
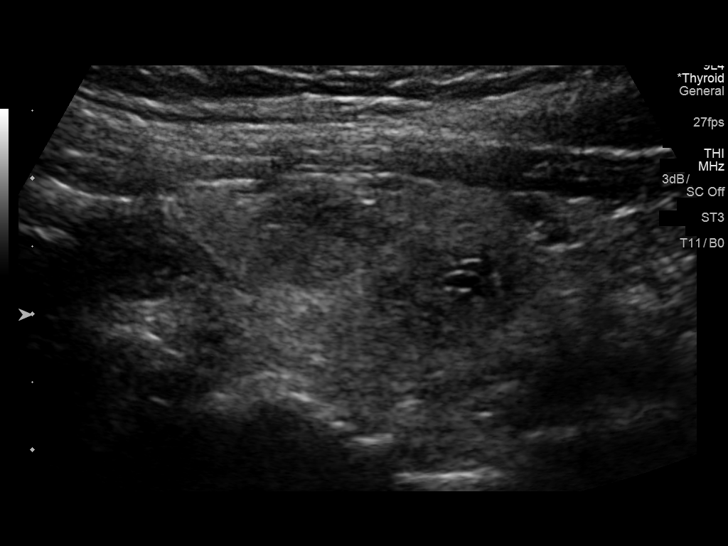
[im 41/45]
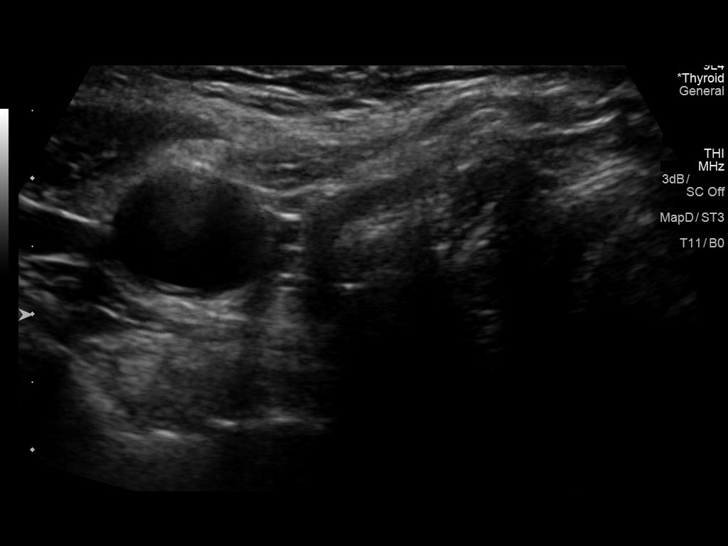
[im 45/45]
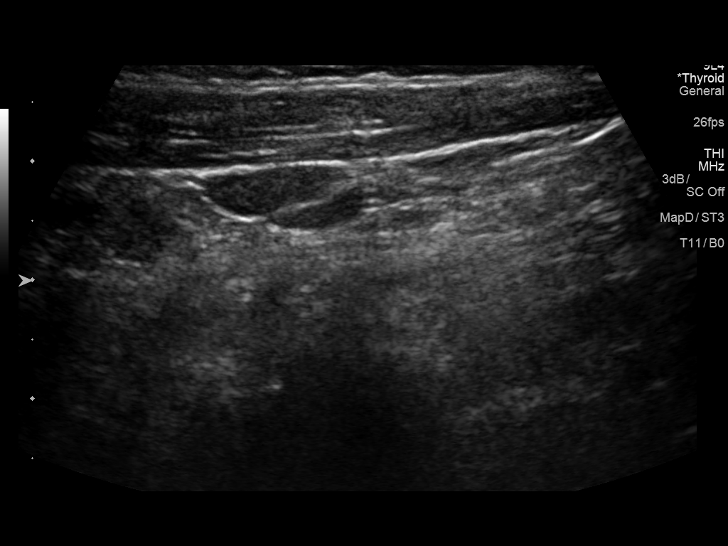

[13 of 25 positions shown; findings below may reference images not displayed]

FINDINGS: Parenchymal Echotexture: Moderately heterogenous

Isthmus: 0.4 cm

Right lobe: 5.0 x 1.2 x 1.5 cm

Left lobe: 4.0 x 1.6 x 1.6 cm

_________________________________________________________

Estimated total number of nodules >/= 1 cm: 2

Number of spongiform nodules >/=  2 cm not described below (TR1): 0

Number of mixed cystic and solid nodules >/= 1.5 cm not described
below (TR2): 0

_________________________________________________________

Nodule # 1:

Location: Right; Mid

Maximum size: 1.1 cm; Other 2 dimensions: 1.0 x 0.7 cm

Composition: solid/almost completely solid (2)

Echogenicity: hypoechoic (2)

Shape: not taller-than-wide (0)

Margins: ill-defined (0)

Echogenic foci: none (0)

ACR TI-RADS total points: 4.

ACR TI-RADS risk category: TR4 (4-6 points).

ACR TI-RADS recommendations:

*Given size (>/= 1 - 1.4 cm) and appearance, a follow-up ultrasound
in 1 year should be considered based on TI-RADS criteria.

_________________________________________________________

Nodule # 2:

Location: Right; Inferior

Maximum size: 1.4 cm; Other 2 dimensions: 0.9 x 0.8 cm

Composition: solid/almost completely solid (2)

Echogenicity: hypoechoic (2)

Shape: not taller-than-wide (0)

Margins: ill-defined (0)

Echogenic foci: none (0)

ACR TI-RADS total points: 4.

ACR TI-RADS risk category: TR4 (4-6 points).

ACR TI-RADS recommendations:

*Given size (>/= 1 - 1.4 cm) and appearance, a follow-up ultrasound
in 1 year should be considered based on TI-RADS criteria.

________________________________________________________
IMPRESSION: 1. Diffusely heterogeneous thyroid gland.
2. Two TI-RADS 4 nodules in the left mid and inferior gland meet
criteria for continued annual ultrasound surveillance until 5 year
stability has been documented.
The above is in keeping with the ACR TI-RADS recommendations - [HOSPITAL] 7386;[DATE].

## 2018-05-20 ENCOUNTER — Telehealth: Payer: Self-pay | Admitting: Certified Nurse Midwife

## 2019-03-03 ENCOUNTER — Ambulatory Visit: Payer: Medicare HMO | Admitting: Certified Nurse Midwife

## 2019-03-12 ENCOUNTER — Ambulatory Visit: Payer: Medicare HMO | Attending: Internal Medicine

## 2019-03-12 DIAGNOSIS — Z23 Encounter for immunization: Secondary | ICD-10-CM | POA: Insufficient documentation

## 2019-03-12 NOTE — Progress Notes (Signed)
   Covid-19 Vaccination Clinic  Name:  Shannon Dickson    MRN: [id number] DOB: 01-Dec-1950  03/12/2019  [name] was observed post Covid-19 immunization for 15 minutes without incidence. She was provided with Vaccine Information Sheet and instruction to access the V-Safe system.   [name] was instructed to call 911 with any severe reactions post vaccine: Marland Kitchen Difficulty breathing  . Swelling of your face and throat  . A fast heartbeat  . A bad rash all over your body  . Dizziness and weakness    Immunizations Administered    Name Date Dose VIS Date Route   Pfizer COVID-19 Vaccine 03/12/2019  9:53 AM 0.3 mL 12/25/2018 Intramuscular   Manufacturer: [place]   Lot: [id number]   [place]: [id number]

## 2019-03-22 IMAGING — DX DG CHEST 2V
2 series · 2 of 2 positions shown · non-contrast
Comparison: 04/14/2012

CLINICAL DATA: Cough variant asthma

EXAM:
CHEST - 2 VIEW

[chest pa]
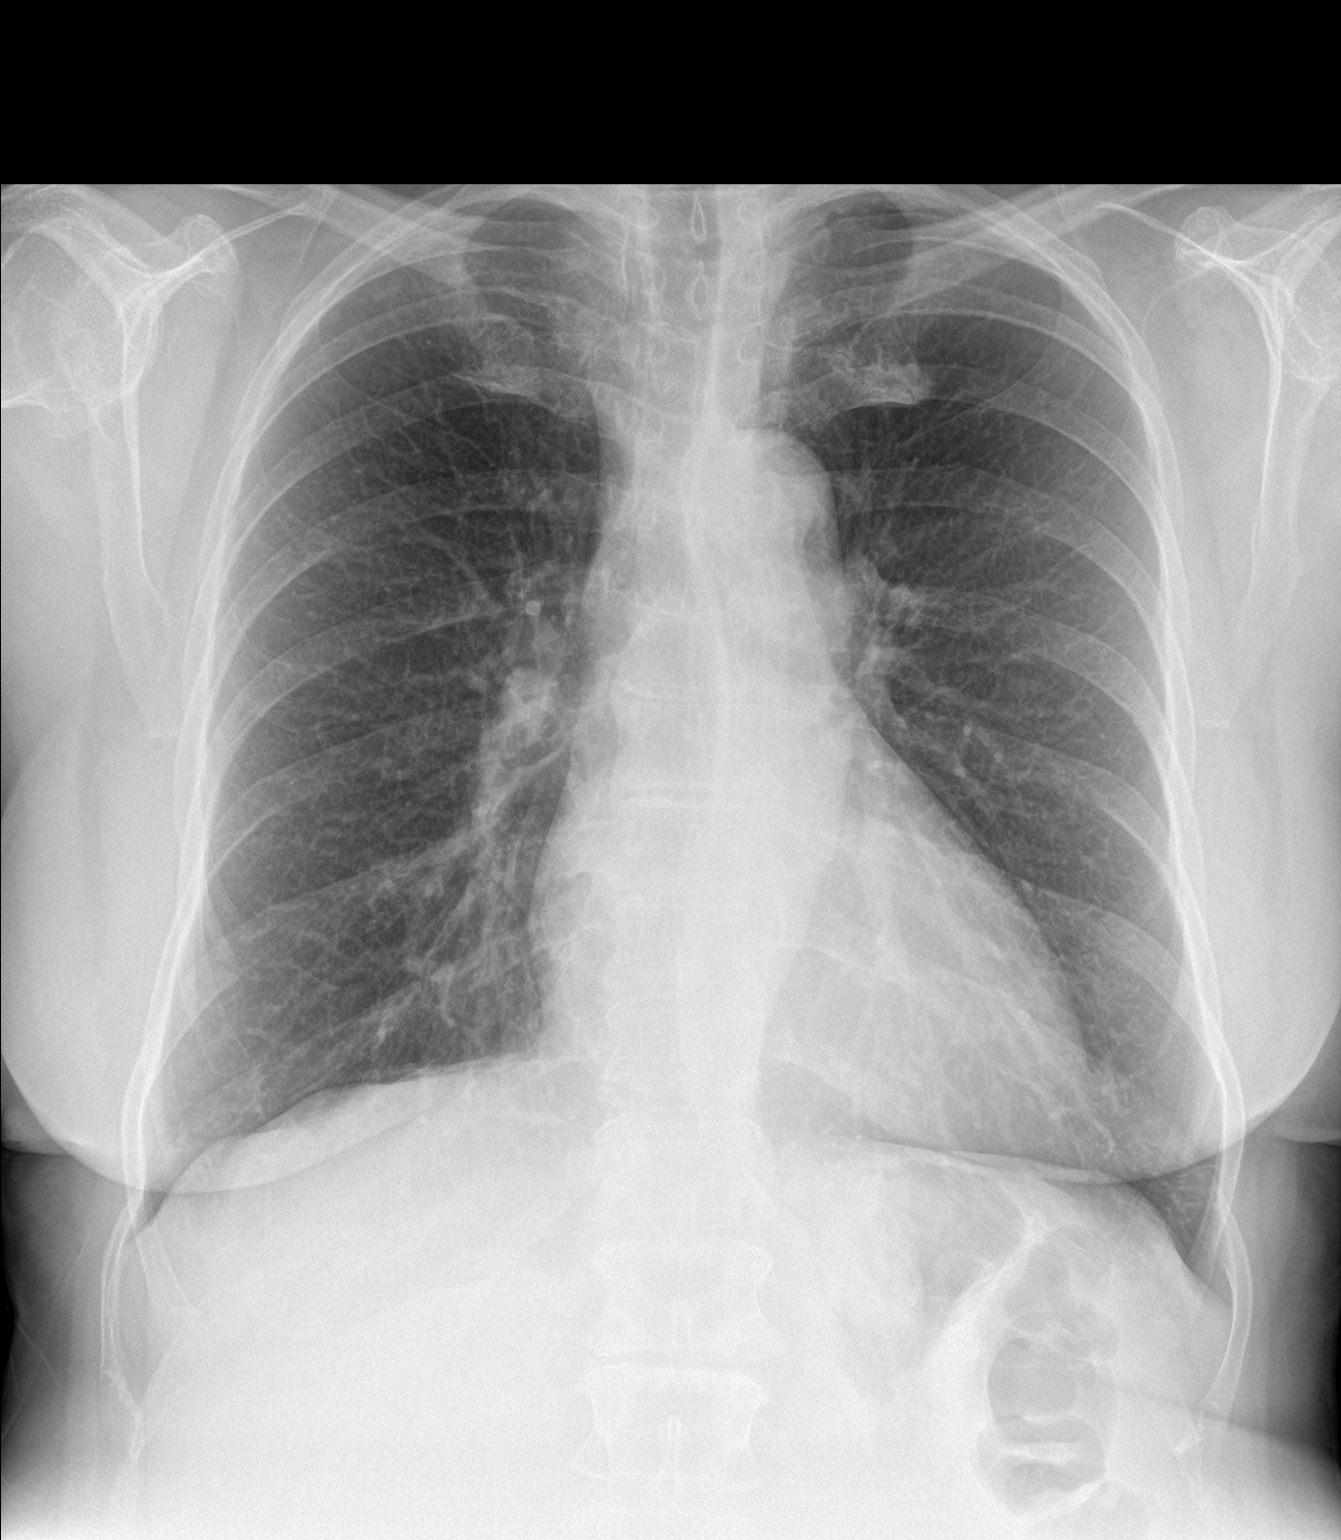

[chest lat]
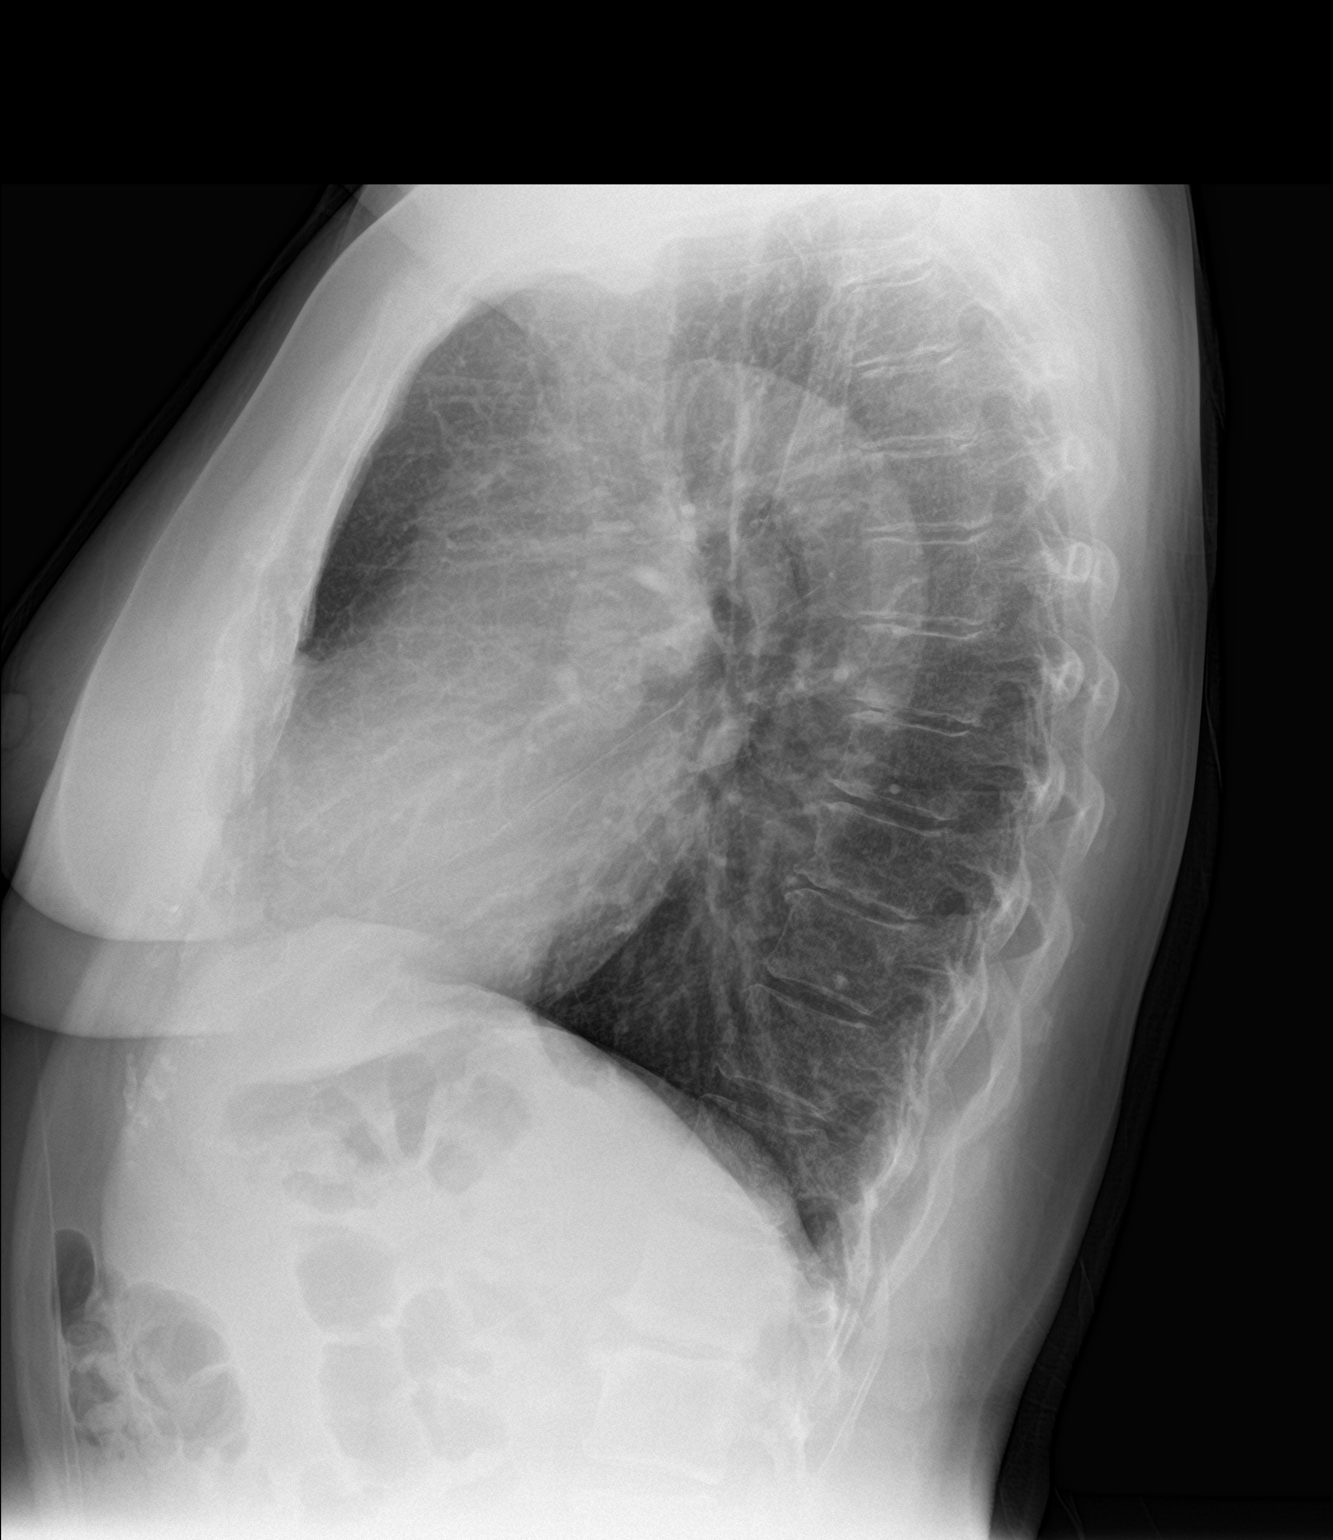

[2 of 2 positions shown; findings below may reference images not displayed]

FINDINGS: The heart size and mediastinal contours are within normal limits.
Both lungs are clear. The visualized skeletal structures are
unremarkable.
IMPRESSION: No active cardiopulmonary disease.

## 2019-04-06 ENCOUNTER — Encounter: Payer: Self-pay | Admitting: Certified Nurse Midwife

## 2019-04-06 ENCOUNTER — Ambulatory Visit: Payer: Medicare HMO | Attending: Internal Medicine

## 2019-04-06 DIAGNOSIS — Z23 Encounter for immunization: Secondary | ICD-10-CM

## 2019-04-06 NOTE — Progress Notes (Signed)
   Covid-19 Vaccination Clinic  Name:  Shannon Dickson    MRN: [id number] DOB: [birth year]  04/06/2019  [name] was observed post Covid-19 immunization for 15 minutes without incident. She was provided with Vaccine Information Sheet and instruction to access the V-Safe system.   [name] was instructed to call 911 with any severe reactions post vaccine: Marland Kitchen Difficulty breathing  . Swelling of face and throat  . A fast heartbeat  . A bad rash all over body  . Dizziness and weakness   Immunizations Administered    Name Date Dose VIS Date Route   Pfizer COVID-19 Vaccine 04/06/2019 11:56 AM 0.3 mL 12/25/2018 Intramuscular   Manufacturer: [place]   Lot: [id number]   [place]: [id number]

## 2020-02-17 DIAGNOSIS — U071 COVID-19: Secondary | ICD-10-CM | POA: Diagnosis not present

## 2020-02-17 DIAGNOSIS — Z20822 Contact with and (suspected) exposure to covid-19: Secondary | ICD-10-CM | POA: Diagnosis not present

## 2020-02-21 DIAGNOSIS — U071 COVID-19: Secondary | ICD-10-CM | POA: Diagnosis not present

## 2020-02-21 DIAGNOSIS — Z20822 Contact with and (suspected) exposure to covid-19: Secondary | ICD-10-CM | POA: Diagnosis not present

## 2020-02-25 DIAGNOSIS — Z20822 Contact with and (suspected) exposure to covid-19: Secondary | ICD-10-CM | POA: Diagnosis not present

## 2020-02-25 DIAGNOSIS — Z03818 Encounter for observation for suspected exposure to other biological agents ruled out: Secondary | ICD-10-CM | POA: Diagnosis not present

## 2020-04-14 ENCOUNTER — Other Ambulatory Visit: Payer: Self-pay | Admitting: Family

## 2020-04-20 ENCOUNTER — Other Ambulatory Visit: Payer: Self-pay | Admitting: Family

## 2020-04-20 DIAGNOSIS — M8580 Other specified disorders of bone density and structure, unspecified site: Secondary | ICD-10-CM

## 2021-01-31 ENCOUNTER — Other Ambulatory Visit (HOSPITAL_COMMUNITY): Payer: Self-pay | Admitting: Family Medicine

## 2021-01-31 DIAGNOSIS — I341 Nonrheumatic mitral (valve) prolapse: Secondary | ICD-10-CM

## 2021-01-31 DIAGNOSIS — R011 Cardiac murmur, unspecified: Secondary | ICD-10-CM

## 2021-02-12 ENCOUNTER — Ambulatory Visit (HOSPITAL_COMMUNITY): Payer: Medicare Other | Attending: Internal Medicine

## 2021-02-12 ENCOUNTER — Other Ambulatory Visit: Payer: Self-pay

## 2021-02-12 DIAGNOSIS — I341 Nonrheumatic mitral (valve) prolapse: Secondary | ICD-10-CM | POA: Insufficient documentation

## 2021-02-12 DIAGNOSIS — R011 Cardiac murmur, unspecified: Secondary | ICD-10-CM | POA: Diagnosis not present

## 2021-02-12 LAB — ECHOCARDIOGRAM COMPLETE
AR max vel: 2.19 cm2
AV Area VTI: 2.25 cm2
AV Area mean vel: 2.19 cm2
AV Mean grad: 7 mmHg
AV Peak grad: 14.2 mmHg
Ao pk vel: 1.89 m/s
Area-P 1/2: 2.85 cm2
S' Lateral: 2.4 cm

## 2021-03-27 ENCOUNTER — Other Ambulatory Visit: Payer: Self-pay | Admitting: Family Medicine

## 2021-03-27 DIAGNOSIS — Z13820 Encounter for screening for osteoporosis: Secondary | ICD-10-CM

## 2021-09-18 ENCOUNTER — Encounter (HOSPITAL_BASED_OUTPATIENT_CLINIC_OR_DEPARTMENT_OTHER): Payer: Self-pay | Admitting: Advanced Practice Midwife

## 2021-09-18 ENCOUNTER — Ambulatory Visit (HOSPITAL_BASED_OUTPATIENT_CLINIC_OR_DEPARTMENT_OTHER): Payer: Medicare Other | Admitting: Advanced Practice Midwife

## 2021-09-18 VITALS — BP 139/66 | HR 57 | Ht 63.5 in | Wt 150.4 lb

## 2021-09-18 DIAGNOSIS — Z1231 Encounter for screening mammogram for malignant neoplasm of breast: Secondary | ICD-10-CM

## 2021-09-18 DIAGNOSIS — N3946 Mixed incontinence: Secondary | ICD-10-CM | POA: Diagnosis not present

## 2021-09-18 DIAGNOSIS — N898 Other specified noninflammatory disorders of vagina: Secondary | ICD-10-CM

## 2021-09-18 DIAGNOSIS — Z01419 Encounter for gynecological examination (general) (routine) without abnormal findings: Secondary | ICD-10-CM

## 2021-09-18 NOTE — Progress Notes (Addendum)
   Subjective:     Shannon Dickson is a 71 y.o. female here at [facility] for a routine exam.  Current complaints: left labial cyst, urinary incontinence.  Personal health questionnaire reviewed: yes.  Do you have a primary care provider? yew Do you feel safe at home? yes  [facility] Office Visit from 09/18/2021 in [place]  PHQ-2 Total Score 0       Health Maintenance Due  Topic Date Due   Zoster Vaccines- Shingrix (1 of 2) Never done   Pneumonia Vaccine 72+ Years old (1 - PCV) Never done   MAMMOGRAM  08/21/2018   COVID-19 Vaccine (3 - Pfizer series) 06/01/2019   TETANUS/TDAP  04/22/2020   INFLUENZA VACCINE  Never done     Risk factors for chronic health problems: Smoking: Alchohol/how much: Pt BMI: Body mass index is 26.22 kg/m.   Gynecologic History Patient's last menstrual period was 08/21/2007 (exact date). Contraception: post menopausal status Last Pap: 2020. Results were: normal with negative HPV Last mammogram: 2022 with [facility], pt schedule with [facility] in Oct/Nov. Results were: normal per pt  Obstetric History OB History  Gravida Para Term Preterm AB Living  0 0 0 0 0 0  SAB IAB Ectopic Multiple Live Births  0 0 0 0 0     The following portions of the patient's history were reviewed and updated as appropriate: allergies, current medications, past family history, past medical history, past social history, past surgical history, and problem list.  Review of Systems Pertinent items noted in HPI and remainder of comprehensive ROS otherwise negative.    Objective:   BP 139/66 (BP Location: Left Arm, Patient Position: Sitting, Cuff Size: Large)   Pulse (!) 57   Ht 5' 3.5" (1.613 m) Comment: Reported  Wt 150 lb 6.4 oz (68.2 kg)   LMP 08/21/2007 (Exact Date)   BMI 26.22 kg/m  VS reviewed, nursing note reviewed,  Constitutional: well developed, well nourished, no distress HEENT: normocephalic CV: normal rate Pulm/chest wall: normal  effort Breast Exam:  exam performed: right breast normal without mass, skin or nipple changes or axillary nodes, left breast normal without mass, skin or nipple changes or axillary nodes Abdomen: soft Neuro: alert and oriented x 3 Skin: warm, dry Psych: affect normal Pelvic exam: Performed: Cervix pink, visually closed, without lesion, scant white creamy discharge, vaginal walls and external genitalia normal Bimanual exam: Cervix 0/long/high, firm, anterior, neg CMT, uterus nontender, nonenlarged, adnexa without tenderness, enlargement, or mass       Assessment/Plan:   1. Well woman exam with routine gynecological exam   2. Screening mammogram for breast cancer --Pt has mammogram at [facility] in [place], will have records sent   3. Mixed urge and stress incontinence --This is a new problem with uncertain prognosis  --Incontinence x 1 year --discussed food triggers, avoid alcohol, caffeine, high acidity foods and see if symptoms improve  - Ambulatory referral to Urogynecology  4. Vaginal lesion --Pt noticed lesion on left labia, not painful but a raised bump 1 week ago. Lesion has now resolved.  --Exam today wnl, pt to notify office if lesion returns. Possibilities discussed with pt including cyst, folliculitis, etc.      No follow-ups on file.   Fatima Blank, CNM 3:48 PM

## 2021-09-19 ENCOUNTER — Encounter (HOSPITAL_BASED_OUTPATIENT_CLINIC_OR_DEPARTMENT_OTHER): Payer: Self-pay | Admitting: Advanced Practice Midwife

## 2021-09-25 DIAGNOSIS — N3946 Mixed incontinence: Secondary | ICD-10-CM | POA: Insufficient documentation

## 2021-10-04 ENCOUNTER — Encounter (HOSPITAL_BASED_OUTPATIENT_CLINIC_OR_DEPARTMENT_OTHER): Payer: Self-pay | Admitting: *Deleted

## 2021-10-08 ENCOUNTER — Telehealth (HOSPITAL_BASED_OUTPATIENT_CLINIC_OR_DEPARTMENT_OTHER): Payer: Self-pay | Admitting: Obstetrics & Gynecology

## 2021-10-08 NOTE — Telephone Encounter (Signed)
Patient called and left a message that she has not got call back about her cream .

## 2021-10-10 ENCOUNTER — Other Ambulatory Visit (HOSPITAL_BASED_OUTPATIENT_CLINIC_OR_DEPARTMENT_OTHER): Payer: Self-pay | Admitting: Advanced Practice Midwife

## 2021-10-10 DIAGNOSIS — N898 Other specified noninflammatory disorders of vagina: Secondary | ICD-10-CM

## 2021-10-10 MED ORDER — NYSTATIN 100000 UNIT/GM EX CREA: 1.0000 | TOPICAL_CREAM | Freq: Two times a day (BID) | CUTANEOUS | 1 refills | Status: DC

## 2021-10-10 NOTE — Progress Notes (Signed)
Pt called the office to follow up on her visit 09/18/21 and requested a cream for vaginal itching.  Nystatin cream sent to pt pharmacy. Pt to notify office if symptoms do not resolve.

## 2021-10-10 NOTE — Telephone Encounter (Signed)
Pt called stating that her pharmacy has not received the cream that Lattie Haw was going to send in for the white spot on her vagina that has caused her some itching. Advised pt that I would reach out to [facility] and she could check with her pharmacy later today.

## 2021-12-11 ENCOUNTER — Encounter (HOSPITAL_BASED_OUTPATIENT_CLINIC_OR_DEPARTMENT_OTHER): Payer: Self-pay | Admitting: *Deleted

## 2021-12-14 DIAGNOSIS — K5730 Diverticulosis of large intestine without perforation or abscess without bleeding: Secondary | ICD-10-CM | POA: Diagnosis not present

## 2021-12-14 DIAGNOSIS — Z86010 Personal history of colonic polyps: Secondary | ICD-10-CM | POA: Diagnosis not present

## 2021-12-14 DIAGNOSIS — Z1211 Encounter for screening for malignant neoplasm of colon: Secondary | ICD-10-CM | POA: Diagnosis not present

## 2021-12-27 ENCOUNTER — Other Ambulatory Visit: Payer: Self-pay | Admitting: Internal Medicine

## 2021-12-27 DIAGNOSIS — E042 Nontoxic multinodular goiter: Secondary | ICD-10-CM

## 2021-12-31 DIAGNOSIS — E041 Nontoxic single thyroid nodule: Secondary | ICD-10-CM | POA: Diagnosis not present

## 2021-12-31 DIAGNOSIS — E785 Hyperlipidemia, unspecified: Secondary | ICD-10-CM | POA: Diagnosis not present

## 2021-12-31 DIAGNOSIS — E559 Vitamin D deficiency, unspecified: Secondary | ICD-10-CM | POA: Diagnosis not present

## 2021-12-31 DIAGNOSIS — I10 Essential (primary) hypertension: Secondary | ICD-10-CM | POA: Diagnosis not present

## 2022-01-03 ENCOUNTER — Other Ambulatory Visit: Payer: Self-pay | Admitting: Internal Medicine

## 2022-01-03 DIAGNOSIS — E042 Nontoxic multinodular goiter: Secondary | ICD-10-CM

## 2022-01-18 DIAGNOSIS — E785 Hyperlipidemia, unspecified: Secondary | ICD-10-CM | POA: Diagnosis not present

## 2022-02-06 ENCOUNTER — Other Ambulatory Visit (HOSPITAL_COMMUNITY)
Admission: RE | Admit: 2022-02-06 | Discharge: 2022-02-06 | Disposition: A | Discharge status: Home / Self Care | Payer: Medicare Other | Source: Ambulatory Visit | Attending: Internal Medicine | Admitting: Internal Medicine

## 2022-02-06 ENCOUNTER — Ambulatory Visit
Admission: RE | Admit: 2022-02-06 | Discharge: 2022-02-06 | Disposition: A | Discharge status: Home / Self Care | Payer: Medicare Other | Source: Ambulatory Visit | Attending: Internal Medicine | Admitting: Internal Medicine

## 2022-02-06 DIAGNOSIS — E042 Nontoxic multinodular goiter: Secondary | ICD-10-CM

## 2022-02-06 DIAGNOSIS — E041 Nontoxic single thyroid nodule: Secondary | ICD-10-CM | POA: Diagnosis not present

## 2022-02-08 LAB — CYTOLOGY - NON PAP

## 2022-02-20 ENCOUNTER — Ambulatory Visit: Payer: Medicare Other | Admitting: Obstetrics and Gynecology

## 2022-02-20 ENCOUNTER — Encounter: Payer: Self-pay | Admitting: Obstetrics and Gynecology

## 2022-02-20 VITALS — BP 137/81 | HR 67 | Ht 62.5 in | Wt 157.0 lb

## 2022-02-20 DIAGNOSIS — R350 Frequency of micturition: Secondary | ICD-10-CM

## 2022-02-20 DIAGNOSIS — N3281 Overactive bladder: Secondary | ICD-10-CM

## 2022-02-20 LAB — POCT URINALYSIS DIPSTICK
Bilirubin, UA: NEGATIVE
Blood, UA: NEGATIVE
Glucose, UA: NEGATIVE
Ketones, UA: NEGATIVE
Leukocytes, UA: NEGATIVE
Nitrite, UA: NEGATIVE
Protein, UA: NEGATIVE
Spec Grav, UA: 1.015 (ref 1.010–1.025)
Urobilinogen, UA: 0.2 E.U./dL
pH, UA: 8.5 — AB (ref 5.0–8.0)

## 2022-02-20 NOTE — Progress Notes (Signed)
[facility] New Patient Evaluation and Consultation  Referring Provider: Elvera Maria,* PCP: Jodene Nam, MD Date of Service: 02/20/2022  SUBJECTIVE Chief Complaint: New Patient (Initial Visit) Shannon Dickson is a 72 y.o. female here for a consult for urinary urgency. Pt said she slao has frequent urination./)  History of Present Illness: Shannon Dickson is a 72 y.o. White or Caucasian female seen in consultation at the request of Dr. Amedeo Gory for evaluation of incontinence.    Review of records significant for: Has mixed incontinence symptoms for over a year.   Urinary Symptoms: Leaks urine with with a full bladder and with urgency. Rare leakage with cough.  Has been going on for several years.  Leaks several times throughout the day.  Pad use: none She is bothered by her UI symptoms.  Day time voids- every 20 min to an hour, sometimes more.  Nocturia: 3 times per night to void. Voiding dysfunction: she empties her bladder well.  does not use a catheter to empty bladder.  When urinating, she feels dribbling after finishing Drinks: 1 cup coffee, a few glasses water per day  UTIs:  0  UTI's in the last year.   Denies history of blood in urine and kidney or bladder stones  Pelvic Organ Prolapse Symptoms:                  She Denies a feeling of a bulge the vaginal area.   Bowel Symptom: Bowel movements: 2 time(s) per day Stool consistency: soft  Straining: no.  Splinting: no.  Incomplete evacuation: no.  Denies accidental bowel leakage / fecal incontinence Bowel regimen: none  Sexual Function Sexually active: yes.  Sexual orientation: Straight Pain with sex: No  Pelvic Pain Denies pelvic pain    Past Medical History:  Past Medical History:  Diagnosis Date   HSV infection    MVP (mitral valve prolapse)    about 30 yrs ago   SOB (shortness of breath) 01/2012   negative cardio, ENT, and pulmonary evaluation     Past Surgical  History:   Past Surgical History:  Procedure Laterality Date   BREAST BIOPSY Right 01/23/2010   fibroadenoma w/microcalcifications   KNEE ARTHROSCOPY Right 03/14/2008   KNEE SURGERY Left 01/14/2006   arthroscopy   SHOULDER SURGERY Right 12/15/2007       TUBAL LIGATION  2004     Past OB/GYN History: OB History  Gravida Para Term Preterm AB Living  0 0 0 0 0 0  SAB IAB Ectopic Multiple Live Births  0 0 0 0 0    Menopausal: Denies vaginal bleeding since menopause Last pap smear was 2020- negative.     Medications: She has a current medication list which includes the following prescription(s): amlodipine-atorvastatin, rosuvastatin, turmeric, UNABLE TO FIND, vitamin d, and fluticasone.   Allergies: Patient is allergic to penicillins.   Social History:  Social History   Tobacco Use   Smoking status: Never   Smokeless tobacco: Never  Substance Use Topics   Alcohol use: Yes    Alcohol/week: 2.0 standard drinks of alcohol    Types: 2 Standard drinks or equivalent per week    Comment: Social   Drug use: No    Relationship status: long-term partner She lives alone She is not employed. Regular exercise: Yes: tennis, gym History of abuse: No  Family History:   Family History  Problem Relation Age of Onset   Diabetes Mother    Diabetes Sister  deceased   Asthma Maternal Aunt      Review of Systems: Review of Systems  Constitutional:  Negative for fever, malaise/fatigue and weight loss.  Respiratory:  Negative for cough, shortness of breath and wheezing.   Cardiovascular:  Negative for chest pain, palpitations and leg swelling.  Gastrointestinal:  Negative for abdominal pain and blood in stool.  Genitourinary:  Negative for dysuria.  Musculoskeletal:  Negative for myalgias.  Skin:  Negative for rash.  Neurological:  Negative for dizziness and headaches.  Endo/Heme/Allergies:  Does not bruise/bleed easily.  Psychiatric/Behavioral:  Negative for depression.  The patient is not nervous/anxious.      OBJECTIVE Physical Exam: Vitals:   02/20/22 1342  BP: 137/81  Pulse: 67  Weight: 157 lb (71.2 kg)  Height: 5' 2.5" (1.588 m)    Physical Exam Constitutional:      General: She is not in acute distress. Pulmonary:     Effort: Pulmonary effort is normal.  Abdominal:     General: There is no distension.     Palpations: Abdomen is soft.     Tenderness: There is no abdominal tenderness. There is no rebound.  Musculoskeletal:        General: No swelling. Normal range of motion.  Skin:    General: Skin is warm and dry.     Findings: No rash.  Neurological:     Mental Status: She is alert and oriented to person, place, and time.  Psychiatric:        Mood and Affect: Mood normal.        Behavior: Behavior normal.      GU / Detailed Urogynecologic Evaluation:  Pelvic Exam: Normal external female genitalia; Bartholin's and Skene's glands normal in appearance; urethral meatus normal in appearance, no urethral masses or discharge.   CST: negative  Speculum exam reveals normal vaginal mucosa with atrophy. Cervix normal appearance. Uterus normal single, nontender. Adnexa no mass, fullness, tenderness.     Pelvic floor strength II/V  Pelvic floor musculature: Right levator non-tender, Right obturator non-tender, Left levator non-tender, Left obturator non-tender. Mildly increased tension.   POP-Q:  - deferred, no prolapse   Rectal Exam:  Normal external rectum  Post-Void Residual (PVR) by Bladder Scan: In order to evaluate bladder emptying, we discussed obtaining a postvoid residual and she agreed to this procedure.  Procedure: The ultrasound unit was placed on the patient's abdomen in the suprapubic region after the patient had voided. A PVR of 187 ml was obtained by bladder scan.  Laboratory Results: POC urine: negative   ASSESSMENT AND PLAN Ms. Frayne is a 72 y.o. with:  1. Overactive bladder   2. Urinary frequency    -  We discussed the symptoms of overactive bladder (OAB), which include urinary urgency, urinary frequency, nocturia, with or without urge incontinence.  While we do not know the exact etiology of OAB, several treatment options exist. We discussed management including behavioral therapy (decreasing bladder irritants, urge suppression strategies, timed voids, bladder retraining), physical therapy, medication; for refractory cases posterior tibial nerve stimulation, sacral neuromodulation, and intravesical botulinum toxin injection.  - PVR today mildly elevated. Will have her undergo urodynamic testing to further evaluate bladder emptying. If within normal limits, then will plan to start medication for OAB symptoms.   Return for urodynamics   Jaquita Folds, MD

## 2022-02-20 NOTE — Patient Instructions (Signed)
We discussed the symptoms of overactive bladder (OAB), which include urinary urgency, urinary frequency, night-time urination, with or without urge incontinence.  We discussed management including behavioral therapy (decreasing bladder irritants by following a bladder diet, urge suppression strategies, timed voids, bladder retraining), physical therapy, medication; and for refractory cases posterior tibial nerve stimulation, sacral neuromodulation, and intravesical botulinum toxin injection.

## 2022-03-04 ENCOUNTER — Encounter: Payer: Self-pay | Admitting: *Deleted

## 2022-03-11 ENCOUNTER — Ambulatory Visit (INDEPENDENT_AMBULATORY_CARE_PROVIDER_SITE_OTHER): Payer: Medicare Other | Admitting: Obstetrics and Gynecology

## 2022-03-11 ENCOUNTER — Encounter: Payer: Self-pay | Admitting: Obstetrics and Gynecology

## 2022-03-11 VITALS — BP 146/79 | HR 63

## 2022-03-11 DIAGNOSIS — N3281 Overactive bladder: Secondary | ICD-10-CM

## 2022-03-11 DIAGNOSIS — R350 Frequency of micturition: Secondary | ICD-10-CM

## 2022-03-11 LAB — POCT URINALYSIS DIPSTICK
Bilirubin, UA: NEGATIVE
Blood, UA: NEGATIVE
Glucose, UA: NEGATIVE
Ketones, UA: NEGATIVE
Leukocytes, UA: NEGATIVE
Nitrite, UA: NEGATIVE
Protein, UA: NEGATIVE
Spec Grav, UA: 1.015 (ref 1.010–1.025)
Urobilinogen, UA: 0.2 E.U./dL
pH, UA: 6.5 (ref 5.0–8.0)

## 2022-03-11 NOTE — Progress Notes (Signed)
[facility] Urodynamics Procedure  Referring Physician: Jodene Nam, MD Date of Procedure: 03/11/2022  Shannon Dickson is a 72 y.o. female who presents for urodynamic evaluation. Indication(s) for study: Incomplete Bladder Emptying  Vital Signs: LMP 08/21/2007 (Exact Date)   Laboratory Results: A catheterized urine specimen revealed:  POC urine: Negative for all components   Voiding Diary: Deferred   Procedure Timeout:  The correct patient was verified and the correct procedure was verified. The patient was in the correct position and safety precautions were reviewed based on at the patient's history.  Urodynamic Procedure A 5F dual lumen urodynamics catheter was placed under sterile conditions into the patient's bladder. A 5F catheter was placed into the rectum in order to measure abdominal pressure. EMG patches were placed in the appropriate position.  All connections were confirmed and calibrations/adjusted made. Saline was instilled into the bladder through the dual lumen catheters.  Cough/valsalva pressures were measured periodically during filling.  Patient was allowed to void.  The bladder was then emptied of its residual.  UROFLOW: Revealed a Qmax of 20.1 mL/sec.  She voided 328.5 mL and had a residual of 0 mL.  It was a interrupted pattern and represented normal habits.   CMG: This was performed with sterile water in the sitting position at a fill rate of 20-30 mL/min.    First sensation of fullness was 87 mLs,  First urge was 99 mLs,  Strong urge was 135 mLs and  Capacity was 380 mLs  Stress incontinence was demonstrated Highest positive Barrier CLPP was 112 cmH20 at 255 ml. Highest negative Barrier VLPP was 47 cmH20 at 144 ml.  Detrusor function was overactive, with phasic contractions seen.  The first occurred at 17 mL to 5.4 cm of water and was associated with urge.  Compliance:  Borderline normal/low. End fill detrusor pressure was 12.8cmH20.   Calculated compliance was 27m/cmH20  UPP: MUCP with barrier reduction was 79 cm of water.    MICTURITION STUDY: Voiding was performed with reduction using scopettes in the sitting position.  Pdet at Qmax was 25.4 cm of water.  Qmax was 12.1 mL/sec.  It was a intermittent pattern.  She voided 310 mL and had a residual of 70 mL.  It was a volitional void, sustained detrusor contraction was present and abdominal straining was not present  EMG: This was performed with patches.  She had voluntary contractions, recruitment with fill was present and urethral sphincter was not relaxed with void.  The details of the procedure with the study tracings have been scanned into EPIC.   Urodynamic Impression:  1. Sensation was normal; capacity was normal 2. Stress Incontinence was demonstrated at normal pressures; 3. Detrusor Overactivity was demonstrated without leakage. 4. Emptying was dysfunctional with a normal PVR, a sustained detrusor contraction present,  abdominal straining not present, dyssynergic urethral sphincter activity on EMG.  Plan: - The patient will follow up  to discuss the findings and treatment options.

## 2022-03-11 NOTE — Patient Instructions (Signed)
Taking Care of Yourself after Urodynamics Drink plenty of water for a day or two following your procedure. Try to have about 8 ounces (one cup) at a time, and do this 6 times or more per day unless you have fluid restrictitons AVOID irritative beverages such as coffee, tea, soda, alcoholic or citrus drinks for a day or two, as this may cause burning with urination.   You may experience some discomfort or a burning sensation with urination after having this procedure. You can use over the counter Azo or pyridium to help with burning and follow the instructions on the packaging. If it does not improve within 1-2 days, or other symptoms appear (fever, chills, or difficulty urinating) call the office to speak to a nurse.  You may return to normal daily activities such as work, school, driving, exercising and housework on the day of the procedure. If your doctor gave you a prescription, take it as ordered.      Today we talked about ways to manage bladder urgency such as altering your diet to avoid irritative beverages and foods (bladder diet) as well as attempting to decrease stress and other exacerbating factors.   The Most Bothersome Foods* The Least Bothersome Foods*  Coffee - Regular & Decaf Tea - caffeinated Carbonated beverages - cola, non-colas, diet & caffeine-free Alcohols - Beer, Red Wine, White Wine, Champagne Fruits - Grapefruit, [place], Orange, [employer] - Cranberry, Grapefruit, Orange, Pineapple Vegetables - Tomato & Tomato Products Flavor Enhancers - Hot peppers, Spicy foods, Chili, Horseradish, Vinegar, Monosodium glutamate (MSG) Artificial Sweeteners - NutraSweet, Sweet 'N Low, Equal (sweetener), Saccharin Ethnic foods - [place], [place], [place] food Express Scripts - low-fat & whole Fruits - Bananas, Blueberries, Honeydew melon, Pears, Raisins, Watermelon Vegetables - Broccoli, Brussels Sprouts, [place], Carrots, Cauliflower, [place], Cucumber, Mushrooms, Peas, Radishes,  Squash, Zucchini, White potatoes, Sweet potatoes & yams Poultry - Chicken, Eggs, [place], [employer] - Beef, Programmer, multimedia, Lamb Seafood - Shrimp, [place] fish, Salmon Grains - Oat, Rice Snacks - Pretzels, Popcorn  *Lissa Morales et al. Diet and its role in interstitial cystitis/bladder pain syndrome (IC/BPS) and comorbid conditions. [place] 2012 Jan 11.

## 2022-03-20 DIAGNOSIS — I10 Essential (primary) hypertension: Secondary | ICD-10-CM | POA: Diagnosis not present

## 2022-03-20 DIAGNOSIS — R5383 Other fatigue: Secondary | ICD-10-CM | POA: Diagnosis not present

## 2022-03-20 DIAGNOSIS — R0609 Other forms of dyspnea: Secondary | ICD-10-CM | POA: Diagnosis not present

## 2022-03-20 DIAGNOSIS — E559 Vitamin D deficiency, unspecified: Secondary | ICD-10-CM | POA: Diagnosis not present

## 2022-03-22 ENCOUNTER — Ambulatory Visit: Payer: Medicare Other | Admitting: Obstetrics and Gynecology

## 2022-03-25 ENCOUNTER — Ambulatory Visit: Payer: Medicare Other | Admitting: Obstetrics and Gynecology

## 2022-04-03 DIAGNOSIS — M9903 Segmental and somatic dysfunction of lumbar region: Secondary | ICD-10-CM | POA: Diagnosis not present

## 2022-04-03 DIAGNOSIS — M9902 Segmental and somatic dysfunction of thoracic region: Secondary | ICD-10-CM | POA: Diagnosis not present

## 2022-04-03 DIAGNOSIS — M9904 Segmental and somatic dysfunction of sacral region: Secondary | ICD-10-CM | POA: Diagnosis not present

## 2022-04-03 DIAGNOSIS — M6240 Contracture of muscle, unspecified site: Secondary | ICD-10-CM | POA: Diagnosis not present

## 2022-04-17 ENCOUNTER — Encounter: Payer: Self-pay | Admitting: Obstetrics and Gynecology

## 2022-04-17 ENCOUNTER — Ambulatory Visit: Payer: Medicare Other | Admitting: Obstetrics and Gynecology

## 2022-04-17 VITALS — BP 143/64 | HR 65

## 2022-04-17 DIAGNOSIS — N3281 Overactive bladder: Secondary | ICD-10-CM

## 2022-04-17 DIAGNOSIS — N393 Stress incontinence (female) (male): Secondary | ICD-10-CM

## 2022-04-17 MED ORDER — TROSPIUM CHLORIDE 20 MG PO TABS: 20.0000 mg | ORAL_TABLET | Freq: Two times a day (BID) | ORAL | 5 refills | Status: DC

## 2022-04-17 NOTE — Progress Notes (Signed)
[facility] Return Visit  SUBJECTIVE  History of Present Illness: Shannon Dickson is a 72 y.o. female seen in follow-up for incontinence. She underwent urodynamic testing.   Urodynamic Impression:  1. Sensation was normal; capacity was normal 2. Stress Incontinence was demonstrated at normal pressures; 3. Detrusor Overactivity was demonstrated without leakage. 4. Emptying was dysfunctional with a normal PVR (61ml), a sustained detrusor contraction present,  abdominal straining not present, dyssynergic urethral sphincter activity on EMG.  Pt reports she has been attending a senior class about controlling her bladder and working on strengthening pelvic floor. Most of her symptoms are urgency related and has rare SUI.   Past Medical History: Patient  has a past medical history of HSV infection, MVP (mitral valve prolapse), and SOB (shortness of breath) (01/2012).   Past Surgical History: She  has a past surgical history that includes Knee surgery (Left, 01/14/2006); Shoulder surgery (Right, 12/15/2007); Knee arthroscopy (Right, 03/14/2008); Breast biopsy (Right, 01/23/2010); and Tubal ligation (2004).   Medications: She has a current medication list which includes the following prescription(s): amlodipine-atorvastatin, rosuvastatin, trospium, turmeric, UNABLE TO FIND, and vitamin d.   Allergies: Patient is allergic to penicillins.   Social History: Patient  reports that she has never smoked. She has never used smokeless tobacco. She reports current alcohol use of about 2.0 standard drinks of alcohol per week. She reports that she does not use drugs.      OBJECTIVE     Physical Exam: Vitals:   04/17/22 1452  BP: (!) 143/64  Pulse: 65   Gen: No apparent distress, A&O x 3.  Detailed Urogynecologic Evaluation:  Deferred.    ASSESSMENT AND PLAN    Shannon Dickson is a 72 y.o. with:  1. Overactive bladder   2. SUI (stress urinary incontinence, female)    - Emptied bladder  well on UDS - We discussed options for pelvic PT, medication or third line therapies such as PTNS, botox and sacral nerve stimulation.  - She is interested in a medication and trospium 20mg  BID was prescribed.  - She would like to continue with her bladder class at the senior center first before starting the medication. She will wait until the class is over to start it.  - SUI not bothersome enough for treatment at this time.   Return 3 months or sooner if needed  Jaquita Folds, MD

## 2022-04-24 ENCOUNTER — Ambulatory Visit: Payer: Medicare Other | Admitting: Obstetrics and Gynecology

## 2022-04-24 ENCOUNTER — Telehealth: Payer: Self-pay

## 2022-04-24 NOTE — Telephone Encounter (Signed)
Shannon Dickson is a 72 y.o. female called in requesting an alternative to Trospium. Pt said its going to cost her $145 for a 90 day supply.

## 2022-04-25 NOTE — Telephone Encounter (Signed)
Please let patient know she can use [facility]Rx at [facility] and price is $28 a month. She can also enquire about the cost of Myrbetriq or Gemtesa but typically these are more expensive. Those are the only options.

## 2022-04-25 NOTE — Telephone Encounter (Signed)
Left a detailed message on the VM.

## 2022-04-29 DIAGNOSIS — R21 Rash and other nonspecific skin eruption: Secondary | ICD-10-CM | POA: Diagnosis not present

## 2022-05-01 DIAGNOSIS — M9903 Segmental and somatic dysfunction of lumbar region: Secondary | ICD-10-CM | POA: Diagnosis not present

## 2022-05-01 DIAGNOSIS — M9902 Segmental and somatic dysfunction of thoracic region: Secondary | ICD-10-CM | POA: Diagnosis not present

## 2022-05-01 DIAGNOSIS — M9904 Segmental and somatic dysfunction of sacral region: Secondary | ICD-10-CM | POA: Diagnosis not present

## 2022-05-01 DIAGNOSIS — M6240 Contracture of muscle, unspecified site: Secondary | ICD-10-CM | POA: Diagnosis not present

## 2022-05-06 DIAGNOSIS — M9903 Segmental and somatic dysfunction of lumbar region: Secondary | ICD-10-CM | POA: Diagnosis not present

## 2022-05-06 DIAGNOSIS — M9902 Segmental and somatic dysfunction of thoracic region: Secondary | ICD-10-CM | POA: Diagnosis not present

## 2022-05-06 DIAGNOSIS — M6240 Contracture of muscle, unspecified site: Secondary | ICD-10-CM | POA: Diagnosis not present

## 2022-05-06 DIAGNOSIS — M9904 Segmental and somatic dysfunction of sacral region: Secondary | ICD-10-CM | POA: Diagnosis not present

## 2022-05-24 DIAGNOSIS — E041 Nontoxic single thyroid nodule: Secondary | ICD-10-CM | POA: Diagnosis not present

## 2022-05-24 LAB — LAB REPORT - SCANNED: EGFR: 75

## 2022-06-11 NOTE — Progress Notes (Unsigned)
Cardiology Office Note:    Date:  06/12/2022   ID:  Shannon Dickson, DOB 11-13-1950, MRN [id number]  PCP:  Orpha Bur, MD   [facility] Providers Cardiologist:  Trellis Vanoverbeke  Click to update primary MD,subspecialty MD or APP then REFRESH:1}    Referring MD: Orpha Bur, MD   Chief Complaint  Patient presents with   Shortness of Breath    History of Present Illness:    Shannon Dickson is a 72 y.o. female with a hx of dyspnea I last saw her in 2014 She had a normal CPX ~ 13 years ago  Has mild diastolic dysfunction   I recommended increasing her exercise.  Jun 12, 2022  She presents today for worsening fatigue . Mother had a pacer   Has a hx of MVP ./mitral regurgitation   Exercised in the past  Some tennis , pickleball  Walks her dog  Does not go to the gym  No PND or orthopnea   Had blood work at [place] [facility] [facility] in Jan. 2024  Hb = 13.3  Creatinine is 4.3 TSH = 1.010 LDL is 83.     Past Medical History:  Diagnosis Date   BMI 27.0-27.9,adult    Cat scratch fever    Exertional dyspnea    Fatigue    HSV infection    HTN (hypertension)    Hypercholesterolemia    MVP (mitral valve prolapse)    about 30 yrs ago   SOB (shortness of breath) 01/15/2012   negative cardio, ENT, and pulmonary evaluation   Thyroid nodule     Past Surgical History:  Procedure Laterality Date   BREAST BIOPSY Right 01/23/2010   fibroadenoma w/microcalcifications   KNEE ARTHROSCOPY Right 03/14/2008   KNEE SURGERY Left 01/14/2006   arthroscopy   SHOULDER SURGERY Right 12/15/2007       TUBAL LIGATION  2004    Current Medications: Current Meds  Medication Sig   amLODipine (NORVASC) 5 MG tablet Take 5 mg by mouth daily.   rosuvastatin (CRESTOR) 20 MG tablet Take 20 mg by mouth daily.   TURMERIC PO Take by mouth.   UNABLE TO FIND Immune support   VITAMIN D PO Take by mouth.   [DISCONTINUED] amLODipine-atorvastatin (CADUET) 5-20 MG tablet Take 1 tablet by mouth  daily.     Allergies:   Penicillins   Social History   Socioeconomic History   Marital status: Divorced    Spouse name: Not on file   Number of children: Not on file   Years of education: Not on file   Highest education level: Not on file  Occupational History   Occupation: Retired    Associate Professor: [employer]  Tobacco Use   Smoking status: Never   Smokeless tobacco: Never  Vaping Use   Vaping Use: Not on file  Substance and Sexual Activity   Alcohol use: Yes    Alcohol/week: 2.0 standard drinks of alcohol    Types: 2 Standard drinks or equivalent per week    Comment: Social   Drug use: No   Sexual activity: Yes    Partners: Male    Birth control/protection: Post-menopausal, Surgical    Comment: btl  Other Topics Concern   Not on file  Social History Narrative   Not on file   Social Determinants of Health   Financial Resource Strain: Not on file  Food Insecurity: Not on file  Transportation Needs: Not on file  Physical Activity: Not on file  Stress: Not on file  Social Connections: Not on file     Family History: The patient's family history includes Asthma in her maternal aunt; Diabetes in her mother and sister.  ROS:   Please see the history of present illness.     All other systems reviewed and are negative.  EKGs/Labs/Other Studies Reviewed:    The following studies were reviewed today:   EKG: Jun 12, 2022: Sinus bradycardia at 59.  Otherwise normal EKG. Recent Labs: No results found for requested labs within last 365 days.  Recent Lipid Panel    Component Value Date/Time   CHOL 239 (H) 02/09/2016 1154   TRIG 124 02/09/2016 1154   HDL 71 02/09/2016 1154   CHOLHDL 3.4 02/09/2016 1154   VLDL 25 02/09/2016 1154   LDLCALC 143 (H) 02/09/2016 1154     Risk Assessment/Calculations:             Physical Exam:    VS:  BP 120/70   Pulse (!) 59   Ht 5' 3.5" (1.613 m)   Wt 155 lb (70.3 kg)   LMP 08/21/2007 (Exact Date)   SpO2 98%   BMI 27.03  kg/m     Wt Readings from Last 3 Encounters:  06/12/22 155 lb (70.3 kg)  02/20/22 157 lb (71.2 kg)  09/18/21 150 lb 6.4 oz (68.2 kg)     GEN:  Well nourished, well developed in no acute distress HEENT: Normal NECK: No JVD; No carotid bruits LYMPHATICS: No lymphadenopathy CARDIAC: RR , , soft systolic murmur radiating to her left axiliary line  RESPIRATORY:  Clear to auscultation without rales, wheezing or rhonchi  ABDOMEN: Soft, non-tender, non-distended MUSCULOSKELETAL:  No edema; No deformity  SKIN: Warm and dry NEUROLOGIC:  Alert and oriented x 3 PSYCHIATRIC:  Normal affect   ASSESSMENT:    1. Nonrheumatic mitral valve regurgitation   2. Primary hypertension    PLAN:      Fatigue:   her MR sounds the same.   Had normal LV function in 2014. Will get a repeat echo I've asked her to work on getting intot a routine exercise program .  Will see her in 1 year for follow up     Medication Adjustments/Labs and Tests Ordered: Current medicines are reviewed at length with the patient today.  Concerns regarding medicines are outlined above.  Orders Placed This Encounter  Procedures   EKG 12-Lead   ECHOCARDIOGRAM COMPLETE   No orders of the defined types were placed in this encounter.   Patient Instructions  Medication Instructions:  Your physician recommends that you continue on your current medications as directed. Please refer to the Current Medication list given to you today.  *If you need a refill on your cardiac medications before your next appointment, please call your pharmacy*  Lab Work: NONE If you have labs (blood work) drawn today and your tests are completely normal, you will receive your results only by: MyChart Message (if you have MyChart) OR A paper copy in the mail If you have any lab test that is abnormal or we need to change your treatment, we will call you to review the results.  Testing/Procedures: ECHO Your physician has requested that you  have an echocardiogram. Echocardiography is a painless test that uses sound waves to create images of your heart. It provides your doctor with information about the size and shape of your heart and how well your heart's chambers and valves are working. This procedure takes approximately one  hour. There are no restrictions for this procedure. Please do NOT wear cologne, perfume, aftershave, or lotions (deodorant is allowed). Please arrive 15 minutes prior to your appointment time.  Follow-Up: At [facility], you and your health needs are our priority.  As part of our continuing mission to provide you with exceptional heart care, we have created designated Provider Care Teams.  These Care Teams include your primary Cardiologist (physician) and Advanced Practice Providers (APPs -  Physician Assistants and Nurse Practitioners) who all work together to provide you with the care you need, when you need it.  Your next appointment:   1 year(s)  Provider:   Kristeen Miss, MD     Signed, Kristeen Miss, MD  06/12/2022 5:05 PM    [facility]

## 2022-06-12 ENCOUNTER — Encounter: Payer: Self-pay | Admitting: Cardiovascular Disease

## 2022-06-12 ENCOUNTER — Ambulatory Visit: Payer: Medicare Other | Attending: Cardiovascular Disease | Admitting: Cardiovascular Disease

## 2022-06-12 VITALS — BP 120/70 | HR 59 | Ht 63.5 in | Wt 155.0 lb

## 2022-06-12 DIAGNOSIS — I10 Essential (primary) hypertension: Secondary | ICD-10-CM

## 2022-06-12 DIAGNOSIS — M9903 Segmental and somatic dysfunction of lumbar region: Secondary | ICD-10-CM | POA: Diagnosis not present

## 2022-06-12 DIAGNOSIS — M9904 Segmental and somatic dysfunction of sacral region: Secondary | ICD-10-CM | POA: Diagnosis not present

## 2022-06-12 DIAGNOSIS — M6240 Contracture of muscle, unspecified site: Secondary | ICD-10-CM | POA: Diagnosis not present

## 2022-06-12 DIAGNOSIS — M9902 Segmental and somatic dysfunction of thoracic region: Secondary | ICD-10-CM | POA: Diagnosis not present

## 2022-06-12 DIAGNOSIS — I340 Nonrheumatic mitral (valve) insufficiency: Secondary | ICD-10-CM

## 2022-06-12 NOTE — Patient Instructions (Signed)
Medication Instructions:  Your physician recommends that you continue on your current medications as directed. Please refer to the Current Medication list given to you today.  *If you need a refill on your cardiac medications before your next appointment, please call your pharmacy*   Lab Work: NONE If you have labs (blood work) drawn today and your tests are completely normal, you will receive your results only by: MyChart Message (if you have MyChart) OR A paper copy in the mail If you have any lab test that is abnormal or we need to change your treatment, we will call you to review the results.  Testing/Procedures: ECHO Your physician has requested that you have an echocardiogram. Echocardiography is a painless test that uses sound waves to create images of your heart. It provides your doctor with information about the size and shape of your heart and how well your heart's chambers and valves are working. This procedure takes approximately one hour. There are no restrictions for this procedure. Please do NOT wear cologne, perfume, aftershave, or lotions (deodorant is allowed). Please arrive 15 minutes prior to your appointment time.  Follow-Up: At [facility], you and your health needs are our priority.  As part of our continuing mission to provide you with exceptional heart care, we have created designated Provider Care Teams.  These Care Teams include your primary Cardiologist (physician) and Advanced Practice Providers (APPs -  Physician Assistants and Nurse Practitioners) who all work together to provide you with the care you need, when you need it.  Your next appointment:   1 year(s)  Provider:   Philip Nahser, MD   

## 2022-06-14 ENCOUNTER — Ambulatory Visit (HOSPITAL_COMMUNITY): Payer: Medicare Other | Attending: Cardiology

## 2022-06-14 ENCOUNTER — Telehealth: Payer: Self-pay | Admitting: Cardiovascular Disease

## 2022-06-14 DIAGNOSIS — I340 Nonrheumatic mitral (valve) insufficiency: Secondary | ICD-10-CM

## 2022-06-14 DIAGNOSIS — I10 Essential (primary) hypertension: Secondary | ICD-10-CM | POA: Diagnosis not present

## 2022-06-14 DIAGNOSIS — Z8249 Family history of ischemic heart disease and other diseases of the circulatory system: Secondary | ICD-10-CM

## 2022-06-14 LAB — ECHOCARDIOGRAM COMPLETE
Area-P 1/2: 3.81 cm2
MV M vel: 5.31 m/s
MV Peak grad: 112.8 mmHg
S' Lateral: 2.6 cm

## 2022-06-14 NOTE — Telephone Encounter (Signed)
Returned call to patient who states she spoke with Dr Elease Hashimoto at her visit this week about a MitraClip and was told she didn't need that and was too young at this point for it. Patient states she feels like (after thought) that if there is a chance it will improve her quality of life, she'd like to do it. Adivsed her that we need to wait on results of today's ECHO to determine mitral regurgitation severity. If not at least moderate in severity, and the fact that she's asymptomatic, she stands a good chance of NOT being a surgical candidate. She verbalized understanding and agrees.  When asked about the CT mentioned in the note, she was very unspecific and state she just wants a test done to determine if "I have something that could have been avoided and they run dye in you for that sort of thing." Educated that she's referring to a coronary CTA but she is completely free of any symptoms and doesn't have a strong family history of CAD-thinks she has a cousin or grandmother that had a heart attack. Advised of coronary calcium score CT option, spoke at length that it's a good imaging test/starting point, preventative treatment, and self pay. She would like this done. Order placed at this time as Dr Elease Hashimoto would be in support of preventative care and feels this test offers a plethora of valuable information.

## 2022-06-14 NOTE — Telephone Encounter (Signed)
Patient is requesting a CT with dye for preventative purposes. Patient mentions that she is also interested in a mitral valve clips.

## 2022-06-17 ENCOUNTER — Ambulatory Visit (HOSPITAL_BASED_OUTPATIENT_CLINIC_OR_DEPARTMENT_OTHER)
Admission: RE | Admit: 2022-06-17 | Discharge: 2022-06-17 | Disposition: A | Discharge status: Home / Self Care | Payer: Medicare Other | Source: Ambulatory Visit | Attending: Cardiovascular Disease | Admitting: Cardiovascular Disease

## 2022-06-17 DIAGNOSIS — Z8249 Family history of ischemic heart disease and other diseases of the circulatory system: Secondary | ICD-10-CM

## 2022-06-26 DIAGNOSIS — H524 Presbyopia: Secondary | ICD-10-CM | POA: Diagnosis not present

## 2022-07-08 DIAGNOSIS — D1721 Benign lipomatous neoplasm of skin and subcutaneous tissue of right arm: Secondary | ICD-10-CM | POA: Diagnosis not present

## 2022-07-08 DIAGNOSIS — H04213 Epiphora due to excess lacrimation, bilateral lacrimal glands: Secondary | ICD-10-CM | POA: Diagnosis not present

## 2022-07-08 DIAGNOSIS — Z0000 Encounter for general adult medical examination without abnormal findings: Secondary | ICD-10-CM | POA: Diagnosis not present

## 2022-07-08 DIAGNOSIS — I10 Essential (primary) hypertension: Secondary | ICD-10-CM | POA: Diagnosis not present

## 2022-07-08 DIAGNOSIS — E041 Nontoxic single thyroid nodule: Secondary | ICD-10-CM | POA: Diagnosis not present

## 2022-07-08 DIAGNOSIS — E785 Hyperlipidemia, unspecified: Secondary | ICD-10-CM | POA: Diagnosis not present

## 2022-08-15 DIAGNOSIS — H04213 Epiphora due to excess lacrimation, bilateral lacrimal glands: Secondary | ICD-10-CM | POA: Diagnosis not present

## 2022-10-21 ENCOUNTER — Other Ambulatory Visit: Payer: Self-pay

## 2022-10-21 MED ORDER — TROSPIUM CHLORIDE 20 MG PO TABS: 20.0000 mg | ORAL_TABLET | Freq: Two times a day (BID) | ORAL | 2 refills | Status: DC

## 2022-10-21 NOTE — Telephone Encounter (Signed)
Patient called requesting Trospium to be sent to her pharmacy due to pateint now willing to try medication. Patient has been informed to schedule an appointment within 3 months for medication follow up.

## 2022-11-11 ENCOUNTER — Ambulatory Visit (HOSPITAL_BASED_OUTPATIENT_CLINIC_OR_DEPARTMENT_OTHER): Payer: Medicare Other | Admitting: Certified Nurse Midwife

## 2022-11-11 ENCOUNTER — Encounter (HOSPITAL_BASED_OUTPATIENT_CLINIC_OR_DEPARTMENT_OTHER): Payer: Self-pay | Admitting: Certified Nurse Midwife

## 2022-11-11 VITALS — BP 134/79 | HR 66 | Ht 63.5 in | Wt 149.2 lb

## 2022-11-11 DIAGNOSIS — R350 Frequency of micturition: Secondary | ICD-10-CM | POA: Diagnosis not present

## 2022-11-11 DIAGNOSIS — N952 Postmenopausal atrophic vaginitis: Secondary | ICD-10-CM | POA: Diagnosis not present

## 2022-11-11 DIAGNOSIS — M810 Age-related osteoporosis without current pathological fracture: Secondary | ICD-10-CM

## 2022-11-11 DIAGNOSIS — Z01419 Encounter for gynecological examination (general) (routine) without abnormal findings: Secondary | ICD-10-CM

## 2022-11-11 MED ORDER — ESTRADIOL 0.1 MG/GM VA CREA
1.0000 | TOPICAL_CREAM | Freq: Every day | VAGINAL | 12 refills | Status: AC
Start: 2022-11-11 — End: ?

## 2022-11-11 NOTE — Progress Notes (Signed)
72 y.o. G0P0000 Divorced White or Caucasian female here for annual exam. Pt wants to know if she could have imaging of ovaries and uterus due to continued urinary frequency. [place] ordered.  Bone Density 11/2021 shows Osteoporosis but pt states she wasn't aware. She reports three falls over the past year (due to being active and exercising) but denies any hx fractures.   Patient's last menstrual period was 08/21/2007 (exact date).          Sexually active: Yes.   She is engaged. He will be having back surgery and hopefully will be able to have intercourse after he recovers from back surgery.  The current method of family planning is post menopausal status.    Exercising: Yes.     Smoker:  no  Health Maintenance: Pap:  UTD, Negative, no further pap smears indicated History of abnormal Pap:  no MMG:  UTD Colonoscopy:  UTD BMD:   Osteoporosis Screening Labs: PCP   reports that she has never smoked. She has never used smokeless tobacco. She reports current alcohol use of about 2.0 standard drinks of alcohol per week. She reports that she does not use drugs.  Past Medical History:  Diagnosis Date   BMI 27.0-27.9,adult    Cat scratch fever    Exertional dyspnea    Fatigue    HSV infection    HTN (hypertension)    Hypercholesterolemia    MVP (mitral valve prolapse)    about 30 yrs ago   SOB (shortness of breath) 01/15/2012   negative cardio, ENT, and pulmonary evaluation   Thyroid nodule     Past Surgical History:  Procedure Laterality Date   BREAST BIOPSY Right 01/23/2010   fibroadenoma w/microcalcifications   KNEE ARTHROSCOPY Right 03/14/2008   KNEE SURGERY Left 01/14/2006   arthroscopy   SHOULDER SURGERY Right 12/15/2007       TUBAL LIGATION  2004    Current Outpatient Medications  Medication Sig Dispense Refill   amLODipine (NORVASC) 5 MG tablet Take 5 mg by mouth daily.     rosuvastatin (CRESTOR) 20 MG tablet Take 20 mg by mouth daily.     trospium (SANCTURA) 20 MG  tablet Take 1 tablet (20 mg total) by mouth 2 (two) times daily. 60 tablet 2   TURMERIC PO Take by mouth.     UNABLE TO FIND Immune support     VITAMIN D PO Take by mouth.     estradiol (ESTRACE) 0.1 MG/GM vaginal cream Place 1 Applicatorful vaginally at bedtime. Place 1 gm vaginally twice weekly 42.5 g 12   No current facility-administered medications for this visit.    Family History  Problem Relation Age of Onset   Diabetes Mother    Diabetes Sister        deceased   Asthma Maternal Aunt     ROS: Constitutional: negative Genitourinary:positive for frequency and urinary incontinence  Exam:   BP 134/79 (BP Location: Left Arm, Patient Position: Sitting, Cuff Size: Normal)   Pulse 66   Ht 5' 3.5" (1.613 m)   Wt 149 lb 3.2 oz (67.7 kg)   LMP 08/21/2007 (Exact Date)   BMI 26.02 kg/m   Height: 5' 3.5" (161.3 cm)  General appearance: alert, cooperative and appears stated age Head: Normocephalic, without obvious abnormality, atraumatic Neck: no adenopathy, supple, symmetrical, trachea midline and thyroid normal to inspection and palpation Lungs: clear to auscultation bilaterally Breasts: normal appearance, no masses or tenderness, Inspection negative, No nipple retraction or dimpling, No nipple  discharge or bleeding, No axillary or supraclavicular adenopathy, Normal to palpation without dominant masses Heart: regular rate and rhythm Abdomen: soft, non-tender; bowel sounds normal; no masses,  no organomegaly Extremities: extremities normal, atraumatic, no cyanosis or edema Skin: Skin color, texture, turgor normal. No rashes or lesions Lymph nodes: Cervical, supraclavicular, and axillary nodes normal. No abnormal inguinal nodes palpated Neurologic: Grossly normal   Pelvic: External genitalia:  no lesions              Urethra:  normal appearing urethra with no masses, tenderness or lesions              Bartholins and Skenes: normal                 Vagina: normal appearing vagina  with normal color and no discharge, no lesions              Cervix: no cervical motion tenderness              Pap taken: No. Bimanual Exam:  Uterus:  enlarged, 8 weeks size              Adnexa: no mass, fullness, tenderness              Anus:  normal sphincter tone, no lesions  Chaperone, Hendricks Milo, CMA, was present for exam.  Assessment/Plan:  1. Vaginal atrophy - estradiol (ESTRACE) 0.1 MG/GM vaginal cream; Place 1 Applicatorful vaginally at bedtime. Place 1 gm vaginally twice weekly  Dispense: 42.5 g; Refill: 12  2. Gynecologic exam normal - Pap smear not indicated at this time per ASCCP guidelines -Continue annual screening mammograms  3. Osteoporosis without current pathological fracture, unspecified osteoporosis type -Will discuss with Ob/Gyn - Plan repeat Bone Density 11/25   4. Urinary Frequency -Has had work-in completed by UroGyn -Pt desires Pelvic US to rule out ovarian pathology (ordered)  Shannon Dickson

## 2022-11-12 ENCOUNTER — Ambulatory Visit (HOSPITAL_BASED_OUTPATIENT_CLINIC_OR_DEPARTMENT_OTHER): Payer: Medicare Other | Admitting: Certified Nurse Midwife

## 2022-11-19 ENCOUNTER — Telehealth (HOSPITAL_BASED_OUTPATIENT_CLINIC_OR_DEPARTMENT_OTHER): Payer: Self-pay | Admitting: *Deleted

## 2022-11-19 NOTE — Telephone Encounter (Signed)
Called pt and set up appt for ultrasound and discussing bone density test results.

## 2022-12-03 ENCOUNTER — Encounter: Payer: Self-pay | Admitting: Obstetrics and Gynecology

## 2022-12-03 ENCOUNTER — Ambulatory Visit: Payer: Medicare Other | Admitting: Obstetrics and Gynecology

## 2022-12-03 VITALS — BP 135/80 | HR 66

## 2022-12-03 DIAGNOSIS — N393 Stress incontinence (female) (male): Secondary | ICD-10-CM | POA: Diagnosis not present

## 2022-12-03 DIAGNOSIS — N3281 Overactive bladder: Secondary | ICD-10-CM

## 2022-12-03 NOTE — Progress Notes (Signed)
Shannon Dickson Return Visit  SUBJECTIVE  History of Present Illness: Shannon Dickson is a 72 y.o. female seen in follow-up for incontinence.   She is on trospium 20mg  BID and she is not sure if it is helping much. Still having urgency and frequency. She is drinking 1 cup per day and drinking lemon water. Drinks maybe 2 glasses of wine per week. She did a pelvic health class which was somewhat helpful.   Past Medical History: Patient  has a past medical history of BMI 27.0-27.9,adult, Cat scratch fever, Exertional dyspnea, Fatigue, HSV infection, HTN (hypertension), Hypercholesterolemia, MVP (mitral valve prolapse), SOB (shortness of breath) (01/15/2012), and Thyroid nodule.   Past Surgical History: She  has a past surgical history that includes Knee surgery (Left, 01/14/2006); Shoulder surgery (Right, 12/15/2007); Knee arthroscopy (Right, 03/14/2008); Breast biopsy (Right, 01/23/2010); and Tubal ligation (2004).   Medications: She has a current medication list which includes the following prescription(s): amlodipine, estradiol, rosuvastatin, trospium, turmeric, UNABLE TO FIND, and vitamin d.   Allergies: Patient is allergic to penicillins.   Social History: Patient  reports that she has never smoked. She has never used smokeless tobacco. She reports current alcohol use of about 2.0 standard drinks of alcohol per week. She reports that she does not use drugs.      OBJECTIVE     Physical Exam: Vitals:   12/03/22 0959  BP: 135/80  Pulse: 66   Gen: No apparent distress, A&O x 3.  Detailed Urogynecologic Evaluation:  Deferred.    ASSESSMENT AND PLAN    Shannon Dickson is a 72 y.o. with:  1. Overactive bladder     - Discussed options of another medicaiton, PTNS, sacral nerve stimulation or botox. She is interested in PTNS treatment. She will fill out a baseline bladder diary. We discussed this therapy is weekly for 12 weeks with tapered treatment after.  - She will work on  reducing caffeine and other bladder irritants.  - SUI not bothersome enough for treatment at this time.   Return for PTNS  Marguerita Beards, MD

## 2022-12-16 ENCOUNTER — Ambulatory Visit: Payer: Medicare Other | Admitting: Obstetrics and Gynecology

## 2022-12-18 ENCOUNTER — Encounter (HOSPITAL_BASED_OUTPATIENT_CLINIC_OR_DEPARTMENT_OTHER): Payer: Self-pay | Admitting: Obstetrics & Gynecology

## 2022-12-18 ENCOUNTER — Ambulatory Visit (HOSPITAL_BASED_OUTPATIENT_CLINIC_OR_DEPARTMENT_OTHER): Payer: Medicare Other | Admitting: Obstetrics & Gynecology

## 2022-12-18 ENCOUNTER — Ambulatory Visit (HOSPITAL_BASED_OUTPATIENT_CLINIC_OR_DEPARTMENT_OTHER): Payer: Medicare Other

## 2022-12-18 VITALS — BP 126/74 | HR 69 | Ht 63.0 in | Wt 148.0 lb

## 2022-12-18 DIAGNOSIS — D270 Benign neoplasm of right ovary: Secondary | ICD-10-CM

## 2022-12-18 DIAGNOSIS — Z1231 Encounter for screening mammogram for malignant neoplasm of breast: Secondary | ICD-10-CM | POA: Diagnosis not present

## 2022-12-18 DIAGNOSIS — R350 Frequency of micturition: Secondary | ICD-10-CM

## 2022-12-18 DIAGNOSIS — Z13820 Encounter for screening for osteoporosis: Secondary | ICD-10-CM | POA: Diagnosis not present

## 2022-12-18 LAB — HM MAMMOGRAPHY

## 2022-12-19 LAB — CA 125: Cancer Antigen (CA) 125: 17 U/mL (ref 0.0–38.1)

## 2022-12-21 NOTE — Progress Notes (Signed)
GYNECOLOGY  VISIT  CC:   Discuss ultrasound results  HPI: 72 y.o. G0P0000 Divorced White or Caucasian female here for discussion of ultrasound results.  Ultrasound obtained due to concerns about cancer.  Ultrasound showed uterus measuring ~5 x 2 x 3cm with endometrium of 1.39mm.  Right ovary with primarily cystic lesion present measuring 6.2 x 4.8 x 6.8cm.  There is normal blood flow to ovary.  No mass noted and no abnormal vasculature noted.   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  Pt comfortable with this and would like to consider removal of both.  Procedure and risks briefly discussed.  Ca-125 recommended as well.   Reasoning discussed and pt comfortable with this as well.   Past Medical History:  Diagnosis Date   BMI 27.0-27.9,adult    Cat scratch fever    Exertional dyspnea    Fatigue    HSV infection    HTN (hypertension)    Hypercholesterolemia    MVP (mitral valve prolapse)    about 30 yrs ago   SOB (shortness of breath) 01/15/2012   negative cardio, ENT, and pulmonary evaluation   Thyroid nodule     MEDS:   Current Outpatient Medications on File Prior to Visit  Medication Sig Dispense Refill   amLODipine (NORVASC) 5 MG tablet Take 5 mg by mouth daily.     estradiol (ESTRACE) 0.1 MG/GM vaginal cream Place 1 Applicatorful vaginally at bedtime. Place 1 gm vaginally twice weekly 42.5 g 12   rosuvastatin (CRESTOR) 20 MG tablet Take 20 mg by mouth daily.     trospium (SANCTURA) 20 MG tablet Take 1 tablet (20 mg total) by mouth 2 (two) times daily. 60 tablet 2   TURMERIC PO Take by mouth.     UNABLE TO FIND Immune support     VITAMIN D PO Take by mouth.     No current facility-administered medications on file prior to visit.    ALLERGIES: Penicillins  SH:  divorced, non smoker  Review of Systems  Constitutional: Negative.   Genitourinary: Negative.     PHYSICAL EXAMINATION:    BP 126/74  (BP Location: Right Arm, Patient Position: Sitting, Cuff Size: Normal)   Pulse 69   Ht 5\' 3"  (1.6 m)   Wt 148 lb (67.1 kg)   LMP 08/21/2007 (Exact Date)   BMI 26.22 kg/m      Physical Exam Constitutional:      Appearance: Normal appearance.  Neurological:     General: No focal deficit present.     Mental Status: She is alert.  Psychiatric:        Mood and Affect: Mood normal.     Assessment/Plan: 1. Ovarian benign neoplasm, right - will obtain ca-125.  If normal, plan to proceed with surgery - CA 125  2. Osteoporosis screening - unrelated, pt will plan to repeat this in 2025.  Order placed.   - DG BONE DENSITY (DXA); Future  Total time with pt: 22 minutes Documentation: 6 minutes Total time:  28 minutes

## 2022-12-23 ENCOUNTER — Ambulatory Visit: Payer: Medicare Other | Admitting: Obstetrics and Gynecology

## 2022-12-30 ENCOUNTER — Ambulatory Visit: Payer: Medicare Other | Admitting: Obstetrics and Gynecology

## 2022-12-30 ENCOUNTER — Ambulatory Visit (HOSPITAL_BASED_OUTPATIENT_CLINIC_OR_DEPARTMENT_OTHER)
Admission: RE | Admit: 2022-12-30 | Discharge: 2022-12-30 | Disposition: A | Discharge status: Home / Self Care | Payer: Medicare Other | Source: Ambulatory Visit | Attending: Obstetrics & Gynecology | Admitting: Obstetrics & Gynecology

## 2022-12-30 DIAGNOSIS — M8589 Other specified disorders of bone density and structure, multiple sites: Secondary | ICD-10-CM | POA: Insufficient documentation

## 2022-12-30 DIAGNOSIS — Z780 Asymptomatic menopausal state: Secondary | ICD-10-CM | POA: Insufficient documentation

## 2022-12-30 DIAGNOSIS — M810 Age-related osteoporosis without current pathological fracture: Secondary | ICD-10-CM | POA: Diagnosis not present

## 2022-12-30 DIAGNOSIS — Z13820 Encounter for screening for osteoporosis: Secondary | ICD-10-CM | POA: Insufficient documentation

## 2023-01-03 ENCOUNTER — Telehealth (HOSPITAL_BASED_OUTPATIENT_CLINIC_OR_DEPARTMENT_OTHER): Payer: Self-pay | Admitting: *Deleted

## 2023-01-03 NOTE — Telephone Encounter (Signed)
Patient called and would like the nurse to please call her.

## 2023-01-13 ENCOUNTER — Ambulatory Visit: Payer: Medicare Other | Admitting: Obstetrics and Gynecology

## 2023-01-14 DIAGNOSIS — E785 Hyperlipidemia, unspecified: Secondary | ICD-10-CM | POA: Diagnosis not present

## 2023-01-14 DIAGNOSIS — Z131 Encounter for screening for diabetes mellitus: Secondary | ICD-10-CM | POA: Diagnosis not present

## 2023-01-14 DIAGNOSIS — I10 Essential (primary) hypertension: Secondary | ICD-10-CM | POA: Diagnosis not present

## 2023-01-14 DIAGNOSIS — E559 Vitamin D deficiency, unspecified: Secondary | ICD-10-CM | POA: Diagnosis not present

## 2023-01-16 ENCOUNTER — Other Ambulatory Visit (HOSPITAL_BASED_OUTPATIENT_CLINIC_OR_DEPARTMENT_OTHER): Payer: Self-pay | Admitting: Obstetrics & Gynecology

## 2023-01-16 DIAGNOSIS — D270 Benign neoplasm of right ovary: Secondary | ICD-10-CM

## 2023-01-20 ENCOUNTER — Telehealth (HOSPITAL_BASED_OUTPATIENT_CLINIC_OR_DEPARTMENT_OTHER): Payer: Self-pay | Admitting: *Deleted

## 2023-01-20 ENCOUNTER — Ambulatory Visit: Payer: Medicare Other | Admitting: Obstetrics and Gynecology

## 2023-01-20 NOTE — Telephone Encounter (Signed)
 Patient called and would like for someone to please call her  back about surgery .

## 2023-01-23 ENCOUNTER — Telehealth: Payer: Self-pay

## 2023-01-23 NOTE — Telephone Encounter (Signed)
 Called pt to let her know that request for surgery has been sent to scheduler. Advised that Shannon Dickson will reach out to her for scheduling. Pt verbalized understanding.

## 2023-01-23 NOTE — Telephone Encounter (Signed)
 Called patient to schedule surgery with Dr. Cleotilde. Patient confirmed she would like to be scheduled at [facility] w/ dr. Cleotilde on 02/25/23. Provided pre-op instructions and surgery details over the phone. Will send details by mail once procedure is scheduled and surgery time is confirmed.

## 2023-01-24 ENCOUNTER — Encounter (HOSPITAL_BASED_OUTPATIENT_CLINIC_OR_DEPARTMENT_OTHER): Payer: Self-pay

## 2023-01-25 ENCOUNTER — Other Ambulatory Visit (HOSPITAL_BASED_OUTPATIENT_CLINIC_OR_DEPARTMENT_OTHER): Payer: Self-pay | Admitting: Obstetrics & Gynecology

## 2023-01-25 DIAGNOSIS — Z01812 Encounter for preprocedural laboratory examination: Secondary | ICD-10-CM

## 2023-01-25 DIAGNOSIS — D270 Benign neoplasm of right ovary: Secondary | ICD-10-CM

## 2023-01-27 ENCOUNTER — Ambulatory Visit: Payer: Medicare Other | Admitting: Obstetrics and Gynecology

## 2023-01-27 ENCOUNTER — Other Ambulatory Visit (HOSPITAL_BASED_OUTPATIENT_CLINIC_OR_DEPARTMENT_OTHER): Payer: Self-pay | Admitting: Obstetrics & Gynecology

## 2023-01-28 ENCOUNTER — Telehealth: Payer: Self-pay

## 2023-01-28 NOTE — Telephone Encounter (Signed)
 Shannon Dickson left me a voicemail and contacted our [place] office to cancel her procedure on 2/11. Patients prefers to wait until [facility] has openings. Procedure was canceled.

## 2023-01-29 ENCOUNTER — Telehealth: Payer: Self-pay

## 2023-01-29 NOTE — Telephone Encounter (Signed)
 I called the patient and left a voicemail letting her know [facility] would be closed for the remainder of 2025. I asked her to give me a call at [phone].

## 2023-01-30 ENCOUNTER — Telehealth: Payer: Self-pay

## 2023-01-30 ENCOUNTER — Other Ambulatory Visit (HOSPITAL_BASED_OUTPATIENT_CLINIC_OR_DEPARTMENT_OTHER): Payer: Self-pay | Admitting: Obstetrics & Gynecology

## 2023-01-30 NOTE — Telephone Encounter (Signed)
Called patient to let her know I rescheduled her 02/25/23 surgery. This morning I noticed the procedure was cancelled, per [facility]. Asked patient to call me back to confirm if she wanted to keep her surgery [phone]

## 2023-02-07 DIAGNOSIS — E785 Hyperlipidemia, unspecified: Secondary | ICD-10-CM | POA: Diagnosis not present

## 2023-02-07 DIAGNOSIS — Z131 Encounter for screening for diabetes mellitus: Secondary | ICD-10-CM | POA: Diagnosis not present

## 2023-02-07 DIAGNOSIS — E559 Vitamin D deficiency, unspecified: Secondary | ICD-10-CM | POA: Diagnosis not present

## 2023-02-07 DIAGNOSIS — I10 Essential (primary) hypertension: Secondary | ICD-10-CM | POA: Diagnosis not present

## 2023-02-10 ENCOUNTER — Encounter (HOSPITAL_BASED_OUTPATIENT_CLINIC_OR_DEPARTMENT_OTHER): Payer: Medicare Other | Admitting: Obstetrics & Gynecology

## 2023-02-10 ENCOUNTER — Other Ambulatory Visit (HOSPITAL_BASED_OUTPATIENT_CLINIC_OR_DEPARTMENT_OTHER): Payer: Self-pay | Admitting: Obstetrics & Gynecology

## 2023-02-10 ENCOUNTER — Ambulatory Visit (HOSPITAL_BASED_OUTPATIENT_CLINIC_OR_DEPARTMENT_OTHER): Payer: Medicare Other | Admitting: Obstetrics & Gynecology

## 2023-02-10 ENCOUNTER — Encounter (HOSPITAL_BASED_OUTPATIENT_CLINIC_OR_DEPARTMENT_OTHER): Payer: Self-pay | Admitting: Obstetrics & Gynecology

## 2023-02-10 VITALS — BP 154/77 | HR 72 | Wt 149.4 lb

## 2023-02-10 DIAGNOSIS — D270 Benign neoplasm of right ovary: Secondary | ICD-10-CM

## 2023-02-10 DIAGNOSIS — N83201 Unspecified ovarian cyst, right side: Secondary | ICD-10-CM

## 2023-02-10 DIAGNOSIS — N952 Postmenopausal atrophic vaginitis: Secondary | ICD-10-CM | POA: Diagnosis not present

## 2023-02-10 DIAGNOSIS — R350 Frequency of micturition: Secondary | ICD-10-CM | POA: Diagnosis not present

## 2023-02-10 DIAGNOSIS — Z01818 Encounter for other preprocedural examination: Secondary | ICD-10-CM

## 2023-02-10 NOTE — Progress Notes (Signed)
73 y.o. G0P0000 Divorced White or Caucasian female here for discussion of upcoming procedure.  Laparoscopic BSO has been planned due to ~7cm right ovarian cyst that was found with ultrasound done 12/18/2022.  Laparoscopic BSO is planned but after discussing with pt today she desires to keep the left ovary.  Discussed removal of both fallopian tubes.  She will be consented for left oophorectomy if any abnormality is noted.     Procedure discussed with patient.  Hospital stay, recovery and pain management all discussed.  Risks discussed including but not limited to bleeding, <1% risk of receiving a  transfusion, infection, <1% risk of bowel/bladder/ureteral/vascular injury discussed as well as possible need for additional surgery if injury does occur discussed.  DVT/PE and rare risk of death discussed.  My actual complications with prior surgeries discussed.  Hernia formation discussed.  Positioning and incision locations discussed.  Patient aware if pathology abnormal she may need additional treatment.  All questions answered.     She does have urinary incontinence and has seen urogynecology.  She is on [place] now.  PTNS recommended but she has decided to wait until after surgery.    Ob Hx:   Patient's last menstrual period was 08/21/2007 (exact date).          Sexually active: No. Birth control:  PMP status Last pap: 02/20/2018 Last MMG: 12/5/20224 Smoking:  remote hx of social smoking  Past Surgical History:  Procedure Laterality Date   BREAST BIOPSY Right 01/23/2010   fibroadenoma w/microcalcifications   KNEE ARTHROSCOPY Right 03/14/2008   KNEE SURGERY Left 01/14/2006   arthroscopy   SHOULDER SURGERY Right 12/15/2007       TUBAL LIGATION  2004    Past Medical History:  Diagnosis Date   BMI 27.0-27.9,adult    Cat scratch fever    Exertional dyspnea    Fatigue    HSV infection    HTN (hypertension)    Hypercholesterolemia    MVP (mitral valve prolapse)    about 30 yrs ago   SOB  (shortness of breath) 01/15/2012   negative cardio, ENT, and pulmonary evaluation   Thyroid nodule     Allergies: Penicillins  Current Outpatient Medications  Medication Sig Dispense Refill   amLODipine (NORVASC) 5 MG tablet Take 5 mg by mouth daily.     APPLE CIDER VINEGAR PO Take 1 tablet by mouth daily.     ASTAXANTHIN PO Take 1 capsule by mouth daily.     BIOTIN PO Take 1 tablet by mouth daily.     CALCIUM PO Take 1 tablet by mouth daily.     estradiol (ESTRACE) 0.1 MG/GM vaginal cream Place 1 Applicatorful vaginally at bedtime. Place 1 gm vaginally twice weekly 42.5 g 12   rosuvastatin (CRESTOR) 20 MG tablet Take 20 mg by mouth daily.     trospium (SANCTURA) 20 MG tablet Take 1 tablet (20 mg total) by mouth 2 (two) times daily. 60 tablet 2   VITAMIN D PO Take 1 tablet by mouth daily.     No current facility-administered medications for this visit.    ROS: A comprehensive review of systems was negative.  Exam:   BP (!) 154/77 (BP Location: Left Arm, Patient Position: Sitting, Cuff Size: Normal)   Pulse 72   Wt 149 lb 6.4 oz (67.8 kg)   LMP 08/21/2007 (Exact Date)   BMI 26.47 kg/m   General appearance: alert and cooperative Head: Normocephalic, without obvious abnormality, atraumatic Neck: no adenopathy, supple, symmetrical, trachea midline  and thyroid not enlarged, symmetric, no tenderness/mass/nodules Lungs: clear to auscultation bilaterally Heart: regular rate and rhythm, S1, S2 normal, no murmur, click, rub or gallop Abdomen: soft, non-tender; bowel sounds normal; no masses,  no organomegaly Extremities: extremities normal, atraumatic, no cyanosis or edema Skin: Skin color, texture, turgor normal. No rashes or lesions   Assessment/Plan: 1. Ovarian benign neoplasm, right (Primary) - laparoscopic RSO and left salpingectomy with possible LSO planned.  Will do cytologic washing. - ca-125 was normal in December - Pre and post op instructions reviewed - Post op pain  medications reviewed - Medications/Vitamins reviewed.    2. Urinary frequency - she will continue her [place]  3. Vaginal atrophy - she will continue her vaginal estrogen cream

## 2023-02-14 ENCOUNTER — Other Ambulatory Visit (HOSPITAL_BASED_OUTPATIENT_CLINIC_OR_DEPARTMENT_OTHER): Payer: Self-pay | Admitting: Certified Nurse Midwife

## 2023-02-21 ENCOUNTER — Other Ambulatory Visit (HOSPITAL_BASED_OUTPATIENT_CLINIC_OR_DEPARTMENT_OTHER): Payer: Self-pay | Admitting: Obstetrics & Gynecology

## 2023-02-21 ENCOUNTER — Encounter (HOSPITAL_BASED_OUTPATIENT_CLINIC_OR_DEPARTMENT_OTHER): Payer: Self-pay | Admitting: Obstetrics & Gynecology

## 2023-02-21 DIAGNOSIS — Z1231 Encounter for screening mammogram for malignant neoplasm of breast: Secondary | ICD-10-CM

## 2023-02-24 ENCOUNTER — Other Ambulatory Visit: Payer: Self-pay

## 2023-02-24 ENCOUNTER — Encounter (HOSPITAL_COMMUNITY): Payer: Self-pay | Admitting: Obstetrics & Gynecology

## 2023-02-24 NOTE — Progress Notes (Signed)
 Case: [id number] Date/Time: 02/25/23 1210   Procedure: LAPAROSCOPIC BILATERAL SALPINGO OOPHORECTOMY WITH CYTOLOGIC WASHINGS (Bilateral)   Anesthesia type: Choice   Pre-op diagnosis: left ovarian neoplasm   [facility]: [facility] 09 / [facility]   Surgeons: Lillian Rein, MD       DISCUSSION: Shannon Dickson is a 73 yo female who presents to PAT prior to surgery above. PMH of HTN, MVP/MR (mild), chronic SOB.  Patient follows with Cardiology for hx of MVP and mild MR. Last seen on 06/12/22 by Dr. Alroy Aspen. She reported fatigue. Echo was updated which showed normal EF and mild MR. CT calcium scoring done which was 0. Advised f/u in 1 year.   PROVIDERS: Arlys Berke, MD Cardiology: Ahmad Alert, MD    IMAGES:   EKG:   CV:  CT calcium score 06/17/22:  IMPRESSION: 1. Coronary calcium score of 0. 2. Mild aortic valve leaflet calcification. 3. Mild posterior mitral annular calcification.  Echo 06/14/22:  IMPRESSIONS    1. Left ventricular ejection fraction, by estimation, is 60 to 65%. The left ventricle has normal function. The left ventricle has no regional wall motion abnormalities. There is mild left ventricular hypertrophy of the basal-septal segment. Left ventricular diastolic parameters are consistent with Grade I diastolic dysfunction (impaired relaxation).  2. Right ventricular systolic function is normal. The right ventricular size is normal.  3. Left atrial size was mildly dilated.  4. Right atrial size was mildly dilated.  5. The mitral valve is degenerative. Mild mitral valve regurgitation. No evidence of mitral stenosis.  6. The aortic valve is tricuspid. There is mild calcification of the aortic valve. Aortic valve regurgitation is not visualized. Aortic valve sclerosis/calcification is present, without any evidence of aortic stenosis.  7. The inferior vena cava is normal in size with greater than 50% respiratory variability, suggesting right atrial pressure of 3  mmHg.  Past Medical History:  Diagnosis Date   BMI 27.0-27.9,adult    Cat scratch fever    Exertional dyspnea    Fatigue    HSV infection    HTN (hypertension)    Hypercholesterolemia    MVP (mitral valve prolapse)    about 30 yrs ago   SOB (shortness of breath) 01/15/2012   negative cardio, ENT, and pulmonary evaluation   Thyroid  nodule     Past Surgical History:  Procedure Laterality Date   BREAST BIOPSY Right 01/23/2010   fibroadenoma w/microcalcifications   KNEE ARTHROSCOPY Right 03/14/2008   KNEE SURGERY Left 01/14/2006   arthroscopy   SHOULDER SURGERY Right 12/15/2007       TUBAL LIGATION  2004    MEDICATIONS: No current facility-administered medications for this encounter.    amLODipine (NORVASC) 5 MG tablet   APPLE CIDER VINEGAR PO   ASTAXANTHIN PO   BIOTIN PO   CALCIUM PO   rosuvastatin (CRESTOR) 20 MG tablet   trospium  (SANCTURA ) 20 MG tablet   VITAMIN D  PO   estradiol  (ESTRACE ) 0.1 MG/GM vaginal cream   Kimble Pennant, PA-C [facility] Surgical Short Stay/Anesthesiology [facility] Phone [phone] 02/24/2023 3:22 PM

## 2023-02-24 NOTE — Progress Notes (Signed)
 SDW CALL  Patient was given pre-op instructions over the phone. The opportunity was given for the patient to ask questions. No further questions asked. Patient verbalized understanding of instructions given.   PCP - Marysue Sola Madded Cardiologist - Dr. Ralene Burger  PPM/ICD - denies Device Orders - n/a Rep Notified - n/a  Chest x-ray -  EKG - 06/12/22 Stress Test -  ECHO - 06/14/22 Cardiac Cath -   Sleep Study - denies CPAP - n/a  No DM  Last dose of GLP1 agonist-  n/a GLP1 instructions: n/a  Blood Thinner Instructions: n/a Aspirin Instructions: n/a  ERAS Protcol - clears until 0915   COVID TEST- n/a   Anesthesia review: yes  Patient denies shortness of breath, fever, cough and chest pain over the phone call   All instructions explained to the patient, with a verbal understanding of the material. Patient agrees to go over the instructions while at home for a better understanding.

## 2023-02-25 ENCOUNTER — Ambulatory Visit (HOSPITAL_COMMUNITY): Admit: 2023-02-25 | Payer: Medicare Other | Admitting: Obstetrics & Gynecology

## 2023-02-25 ENCOUNTER — Ambulatory Visit (HOSPITAL_BASED_OUTPATIENT_CLINIC_OR_DEPARTMENT_OTHER): Payer: Medicare Other | Admitting: Medical

## 2023-02-25 ENCOUNTER — Encounter (HOSPITAL_COMMUNITY): Payer: Self-pay | Admitting: Obstetrics & Gynecology

## 2023-02-25 ENCOUNTER — Other Ambulatory Visit (HOSPITAL_BASED_OUTPATIENT_CLINIC_OR_DEPARTMENT_OTHER): Payer: Self-pay | Admitting: Obstetrics & Gynecology

## 2023-02-25 ENCOUNTER — Other Ambulatory Visit: Payer: Self-pay

## 2023-02-25 ENCOUNTER — Ambulatory Visit (HOSPITAL_COMMUNITY): Payer: Medicare Other | Admitting: Medical

## 2023-02-25 ENCOUNTER — Other Ambulatory Visit (HOSPITAL_COMMUNITY): Payer: Self-pay

## 2023-02-25 ENCOUNTER — Encounter (HOSPITAL_COMMUNITY)
Admission: RE | Disposition: A | Discharge status: Home / Self Care | Payer: Self-pay | Source: Home / Self Care | Attending: Obstetrics & Gynecology

## 2023-02-25 ENCOUNTER — Ambulatory Visit (HOSPITAL_COMMUNITY)
Admission: RE | Admit: 2023-02-25 | Discharge: 2023-02-25 | Disposition: A | Discharge status: Home / Self Care | Payer: Medicare Other | Attending: Obstetrics & Gynecology | Admitting: Obstetrics & Gynecology

## 2023-02-25 DIAGNOSIS — N83202 Unspecified ovarian cyst, left side: Secondary | ICD-10-CM | POA: Insufficient documentation

## 2023-02-25 DIAGNOSIS — D270 Benign neoplasm of right ovary: Secondary | ICD-10-CM

## 2023-02-25 DIAGNOSIS — I10 Essential (primary) hypertension: Secondary | ICD-10-CM | POA: Insufficient documentation

## 2023-02-25 DIAGNOSIS — D271 Benign neoplasm of left ovary: Secondary | ICD-10-CM

## 2023-02-25 DIAGNOSIS — N83292 Other ovarian cyst, left side: Secondary | ICD-10-CM | POA: Diagnosis not present

## 2023-02-25 DIAGNOSIS — I341 Nonrheumatic mitral (valve) prolapse: Secondary | ICD-10-CM | POA: Diagnosis not present

## 2023-02-25 DIAGNOSIS — N83201 Unspecified ovarian cyst, right side: Secondary | ICD-10-CM

## 2023-02-25 DIAGNOSIS — Z01818 Encounter for other preprocedural examination: Secondary | ICD-10-CM

## 2023-02-25 HISTORY — PX: LAPAROSCOPIC BILATERAL SALPINGO OOPHERECTOMY: SHX5890

## 2023-02-25 LAB — BASIC METABOLIC PANEL
Anion gap: 4 — ABNORMAL LOW (ref 5–15)
BUN: 16 mg/dL (ref 8–23)
CO2: 22 mmol/L (ref 22–32)
Calcium: 8.3 mg/dL — ABNORMAL LOW (ref 8.9–10.3)
Chloride: 109 mmol/L (ref 98–111)
Creatinine, Ser: 0.78 mg/dL (ref 0.44–1.00)
GFR, Estimated: >60 mL/min (ref >60)
Glucose, Bld: 100 mg/dL — ABNORMAL HIGH (ref 70–99)
Potassium: 3.9 mmol/L (ref 3.5–5.1)
Sodium: 135 mmol/L (ref 135–145)

## 2023-02-25 LAB — CBC
HCT: 44 % (ref 36.0–46.0)
Hemoglobin: 14 g/dL (ref 12.0–15.0)
MCH: 27.3 pg (ref 26.0–34.0)
MCHC: 31.8 g/dL (ref 30.0–36.0)
MCV: 85.8 fL (ref 80.0–100.0)
Platelets: 230 10*3/uL (ref 150–400)
RBC: 5.13 MIL/uL — ABNORMAL HIGH (ref 3.87–5.11)
RDW: 12.8 % (ref 11.5–15.5)
WBC: 5.5 10*3/uL (ref 4.0–10.5)
nRBC: 0 % (ref 0.0–0.2)

## 2023-02-25 LAB — TYPE AND SCREEN
ABO/RH(D): O POS
Antibody Screen: NEGATIVE

## 2023-02-25 SURGERY — SALPINGO-OOPHORECTOMY, BILATERAL, LAPAROSCOPIC
Anesthesia: Choice | Laterality: Bilateral

## 2023-02-25 SURGERY — SALPINGO-OOPHORECTOMY, BILATERAL, LAPAROSCOPIC
Anesthesia: General | Site: Abdomen | Laterality: Bilateral

## 2023-02-25 MED ORDER — DEXAMETHASONE SODIUM PHOSPHATE 10 MG/ML IJ SOLN
INTRAMUSCULAR | Status: AC
  Filled 2023-02-25: qty 1

## 2023-02-25 MED ORDER — SODIUM CHLORIDE (PF) 0.9 % IJ SOLN
INTRAMUSCULAR | Status: AC
  Filled 2023-02-25: qty 30

## 2023-02-25 MED ORDER — LIDOCAINE 2% (20 MG/ML) 5 ML SYRINGE
INTRAMUSCULAR | Status: DC | PRN
  Administered 2023-02-25: 60 mg via INTRAVENOUS

## 2023-02-25 MED ORDER — PROPOFOL 10 MG/ML IV BOLUS
INTRAVENOUS | Status: AC
  Filled 2023-02-25: qty 20

## 2023-02-25 MED ORDER — ORAL CARE MOUTH RINSE: 15.0000 mL | Freq: Once | OROMUCOSAL | Status: AC

## 2023-02-25 MED ORDER — CHLORHEXIDINE GLUCONATE 0.12 % MT SOLN
15.0000 mL | Freq: Once | OROMUCOSAL | Status: AC
  Administered 2023-02-25: 15 mL via OROMUCOSAL
  Filled 2023-02-25: qty 15

## 2023-02-25 MED ORDER — POVIDONE-IODINE 10 % EX SWAB
2.0000 | Freq: Once | CUTANEOUS | Status: AC
  Administered 2023-02-25: 2 via TOPICAL

## 2023-02-25 MED ORDER — BUPIVACAINE HCL (PF) 0.25 % IJ SOLN
INTRAMUSCULAR | Status: DC | PRN
  Administered 2023-02-25: 6 mL

## 2023-02-25 MED ORDER — DROPERIDOL 2.5 MG/ML IJ SOLN: 0.6250 mg | Freq: Once | INTRAMUSCULAR | Status: DC | PRN

## 2023-02-25 MED ORDER — PROPOFOL 10 MG/ML IV BOLUS
INTRAVENOUS | Status: DC | PRN
Start: 2023-02-25 — End: 2023-02-25
  Administered 2023-02-25: 130 mg via INTRAVENOUS

## 2023-02-25 MED ORDER — ONDANSETRON HCL 4 MG/2ML IJ SOLN
INTRAMUSCULAR | Status: AC
  Filled 2023-02-25: qty 2

## 2023-02-25 MED ORDER — OXYCODONE HCL 5 MG PO TABS
ORAL_TABLET | ORAL | Status: AC
  Filled 2023-02-25: qty 1

## 2023-02-25 MED ORDER — FENTANYL CITRATE (PF) 250 MCG/5ML IJ SOLN
INTRAMUSCULAR | Status: DC | PRN
  Administered 2023-02-25 (×2): 50 ug via INTRAVENOUS

## 2023-02-25 MED ORDER — OXYCODONE HCL 5 MG PO TABS
5.0000 mg | ORAL_TABLET | Freq: Once | ORAL | Status: AC | PRN
  Administered 2023-02-25: 5 mg via ORAL

## 2023-02-25 MED ORDER — LACTATED RINGERS IV SOLN: INTRAVENOUS | Status: DC

## 2023-02-25 MED ORDER — ACETAMINOPHEN 500 MG PO TABS
1000.0000 mg | ORAL_TABLET | ORAL | Status: AC
  Administered 2023-02-25: 1000 mg via ORAL
  Filled 2023-02-25: qty 2

## 2023-02-25 MED ORDER — ROCURONIUM BROMIDE 10 MG/ML (PF) SYRINGE
PREFILLED_SYRINGE | INTRAVENOUS | Status: DC | PRN
  Administered 2023-02-25: 60 mg via INTRAVENOUS

## 2023-02-25 MED ORDER — FENTANYL CITRATE (PF) 100 MCG/2ML IJ SOLN: 25.0000 ug | INTRAMUSCULAR | Status: DC | PRN

## 2023-02-25 MED ORDER — HYDROCODONE-ACETAMINOPHEN 5-325 MG PO TABS
1.0000 | ORAL_TABLET | Freq: Four times a day (QID) | ORAL | 0 refills | Status: DC | PRN
  Filled 2023-02-25: qty 15, 2d supply, fill #0

## 2023-02-25 MED ORDER — LIDOCAINE 2% (20 MG/ML) 5 ML SYRINGE
INTRAMUSCULAR | Status: AC
  Filled 2023-02-25: qty 5

## 2023-02-25 MED ORDER — ROCURONIUM BROMIDE 10 MG/ML (PF) SYRINGE
PREFILLED_SYRINGE | INTRAVENOUS | Status: AC
  Filled 2023-02-25: qty 10

## 2023-02-25 MED ORDER — ONDANSETRON HCL 4 MG/2ML IJ SOLN
INTRAMUSCULAR | Status: DC | PRN
  Administered 2023-02-25: 4 mg via INTRAVENOUS

## 2023-02-25 MED ORDER — FENTANYL CITRATE (PF) 250 MCG/5ML IJ SOLN
INTRAMUSCULAR | Status: AC
  Filled 2023-02-25: qty 5

## 2023-02-25 MED ORDER — MIDAZOLAM HCL 2 MG/2ML IJ SOLN
INTRAMUSCULAR | Status: AC
  Filled 2023-02-25: qty 2

## 2023-02-25 MED ORDER — SUGAMMADEX SODIUM 200 MG/2ML IV SOLN
INTRAVENOUS | Status: DC | PRN
  Administered 2023-02-25: 200 mg via INTRAVENOUS

## 2023-02-25 MED ORDER — BUPIVACAINE HCL (PF) 0.25 % IJ SOLN
INTRAMUSCULAR | Status: AC
  Filled 2023-02-25: qty 30

## 2023-02-25 MED ORDER — EPHEDRINE SULFATE-NACL 50-0.9 MG/10ML-% IV SOSY
PREFILLED_SYRINGE | INTRAVENOUS | Status: DC | PRN
  Administered 2023-02-25: 10 mg via INTRAVENOUS

## 2023-02-25 MED ORDER — MIDAZOLAM HCL 2 MG/2ML IJ SOLN
INTRAMUSCULAR | Status: DC | PRN
  Administered 2023-02-25: 2 mg via INTRAVENOUS

## 2023-02-25 MED ORDER — EPHEDRINE 5 MG/ML INJ
INTRAVENOUS | Status: AC
  Filled 2023-02-25: qty 5

## 2023-02-25 MED ORDER — OXYCODONE HCL 5 MG/5ML PO SOLN: 5.0000 mg | Freq: Once | ORAL | Status: AC | PRN

## 2023-02-25 MED ORDER — DEXAMETHASONE SODIUM PHOSPHATE 10 MG/ML IJ SOLN
INTRAMUSCULAR | Status: DC | PRN
  Administered 2023-02-25: 10 mg via INTRAVENOUS

## 2023-02-25 SURGICAL SUPPLY — 32 items
BAG COUNTER SPONGE SURGICOUNT (BAG) ×2 IMPLANT
CABLE HIGH FREQUENCY MONO STRZ (ELECTRODE) IMPLANT
DERMABOND ADVANCED .7 DNX12 (GAUZE/BANDAGES/DRESSINGS) ×2 IMPLANT
DILATOR CANAL MILEX (MISCELLANEOUS) IMPLANT
DURAPREP 26ML APPLICATOR (WOUND CARE) ×2 IMPLANT
GLOVE BIOGEL PI IND STRL 7.0 (GLOVE) ×8 IMPLANT
GLOVE ECLIPSE 6.5 STRL STRAW (GLOVE) ×2 IMPLANT
GOWN STRL REUS W/ TWL LRG LVL3 (GOWN DISPOSABLE) ×6 IMPLANT
IRRIG SUCT STRYKERFLOW 2 WTIP (MISCELLANEOUS) ×1 IMPLANT
IRRIGATION SUCT STRKRFLW 2 WTP (MISCELLANEOUS) IMPLANT
KIT PINK PAD W/HEAD ARE REST (MISCELLANEOUS) ×1 IMPLANT
KIT PINK PAD W/HEAD ARM REST (MISCELLANEOUS) ×2 IMPLANT
KIT TURNOVER KIT B (KITS) ×2 IMPLANT
LIGASURE VESSEL 5MM BLUNT TIP (ELECTROSURGICAL) IMPLANT
NDL INSUFFLATION 14GA 120MM (NEEDLE) ×2 IMPLANT
NEEDLE INSUFFLATION 14GA 120MM (NEEDLE) ×1 IMPLANT
PACK LAPAROSCOPY BASIN (CUSTOM PROCEDURE TRAY) ×2 IMPLANT
POUCH LAPAROSCOPIC INSTRUMENT (MISCELLANEOUS) ×2 IMPLANT
PROTECTOR NERVE ULNAR (MISCELLANEOUS) ×4 IMPLANT
SHEARS HARMONIC ACE PLUS 36CM (ENDOMECHANICALS) IMPLANT
SLEEVE ADV FIXATION 5X100MM (TROCAR) IMPLANT
SUT VICRYL 0 UR6 27IN ABS (SUTURE) IMPLANT
SUT VICRYL 4-0 PS2 18IN ABS (SUTURE) ×2 IMPLANT
SYR 30ML LL (SYRINGE) IMPLANT
SYS BAG RETRIEVAL 10MM (BASKET) IMPLANT
SYSTEM BAG RETRIEVAL 10MM (BASKET) IMPLANT
TOWEL GREEN STERILE FF (TOWEL DISPOSABLE) ×4 IMPLANT
TRAY FOLEY W/BAG SLVR 14FR (SET/KITS/TRAYS/PACK) ×2 IMPLANT
TROCAR 11X100 Z THREAD (TROCAR) IMPLANT
TROCAR ADV FIXATION 5X100MM (TROCAR) ×2 IMPLANT
TROCAR XCEL NON-BLD 5MMX100MML (ENDOMECHANICALS) ×4 IMPLANT
WARMER LAPAROSCOPE (MISCELLANEOUS) ×2 IMPLANT

## 2023-02-25 NOTE — Op Note (Signed)
02/25/2023  2:29 PM  PATIENT:  Shannon Dickson  73 y.o. female  PRE-OPERATIVE DIAGNOSIS:  left ovarian neoplasm  POST-OPERATIVE DIAGNOSIS:  left ovarian neoplasm  PROCEDURE:  Procedure(s): LAPAROSCOPIC RIGHT SALPINGOOPHORECTOMY, LEFT SALPINGECTOMY, CYTOLOGIC WASHINGS  SURGEON:  Jerene Bears  ASSISTANTS: Milas Hock, MD.  An experienced assistant was required given the standard of surgical care given the complexity of the case.  This assistant was needed for exposure, dissection, suctioning, retraction, instrument exchange and for overall help during the procedure.  RNFA help was also unavailable.  ANESTHESIA:   general  ESTIMATED BLOOD LOSS: 10 mL  BLOOD ADMINISTERED:none   FLUIDS: 600cc LR  UOP: 50cc clear UOP  SPECIMEN:  right fallopian tube and ovary, left fallopian tubes, washings  DISPOSITION OF SPECIMEN:  PATHOLOGY  FINDINGS: enlarged right ovary, smooth and mobile, both fallopian tubes are missing portions due to prior BTL, left ovarian adhesions, left colon adhesions  DESCRIPTION OF OPERATION: Patient is taken to the operating room. She is placed in the supine position. She is a running IV in place. Informed consent was present on the chart. SCDs on her lower extremities and functioning properly. Patient was positioned while she was awake.  Her legs were placed in the low lithotomy position in [place]ela stirrups. Her arms were tucked by the side.  General endotracheal anesthesia was administered by the anesthesia staff without difficulty. Dr. Stephannie Peters, anesthesia, oversaw case.  Time out performed.    Clora prep was then used to prep the abdomen and Hibiclens was used to prep the inner thighs, perineum and vagina. Once 3 minutes had past the patient was draped in a normal standard fashion. The legs were lifted to the high lithotomy position. The cervix was visualized by placing a heavy weighted speculum in the posterior aspect of the vagina and using a curved Deaver retractor to the  retract anteriorly. The anterior lip of the cervix was grasped with single-tooth tenaculum.  The cervix sounded to 5.5 cm. I could not get a Hulka clamp to pass and attach to the cervix due to small size of the endometrial cavity, so this was not placed.  Tenaculum was removed.  Speculum was removed.  Foley was placed to straight drain.  Clear urine was noted. Legs were lowered to the low lithotomy position and attention was turned the abdomen.  The umbilicus was everted.  Marcaine 0.25% used to anesthetize the skin.  Using #11 blade, 5mm skin incision was made.  A Veress needle was obtained. Syringe of sterile saline was placed on a open Veress needle.  With the abdomen elevated, the Veress needle was passed into the umbilicus until the pop was heard and then fluid started to drip.  Then low flow CO2 gas was attached the needle and the pneumoperitoneum was achieved without difficulty. Once four liters of gas was in the abdomen the Veress needle was removed and a 5 millimeter non-bladed Optiview trocar and port were passed directly to the abdomen. The laparoscope was then used to confirm intraperitoneal placement. Findings noted above .  Locations for RLQ and LLQ ports were noted by transillumination of the abdominal wall.  0.25% marcaine was used to anesthetize the skin.  5mm skin incision was made in the RLQ and 5mm trochar and and port were placed.  Then a 10mm skin incision was made and a 10mm nonbladed trochar and port was placed in the LLQ.  All trochars were removed.    Ureters were identified.  Washings obtained.  Attention  was turned to the right side.The right IP ligament was serially clamped, cauterized and incised.  Then the uterovarian pedicle was clamped, cauterized and incised, freeing the ovary.  Attention was turned to the left side.  The fallopian tube was elevated and excised from the mesosalpinx and ovary using the ligasure device.    Then using a laparoscopic bag, the ovary and tube were  placed in this and this was brought up through the LLQ port.  The port was removed.  The bag was opened.  The cyst was incised in the bag and the ovary was then able to be removed through the LLQ incision, in the bag.  No fluid spill occurred in the abdomen.  The port was replaced.  Pelvis was irrigated.  CO2 gas was lowered to 8mm Hg.  No bleeding was noted.    The LLQ incision was closed at the fascial layer with #0 vicryl.  At this point the procedure was completed.  The remaining instruments were removed.  The ports (except the RLQ) were removed under direct visualization of the laparoscope and the pneumoperitoneum was relieved.  The patient was taken out of Trendelenburg positioning.  Several deep breaths were given to the patient's trying to any gas the abdomen and finally the suprapubic port was removed.  The skin was then closed with subcuticular stitches of 3-0 Vicryl. The skin was cleansed Dermabond was applied. Attention was then turned the vagina.  Foley was removed.  No bleeding was noted.  Sponge, lap, needle, instrument counts were correct x2. Patient tolerated the procedure very well. She was awakened from anesthesia, extubated and taken to recovery in stable condition.    COUNTS:  YES  PLAN OF CARE: Transfer to PACU

## 2023-02-25 NOTE — Transfer of Care (Signed)
Immediate Anesthesia Transfer of Care Note  Patient: Shannon Dickson  Procedure(s) Performed: LAPAROSCOPIC BILATERAL SALPINGO OOPHORECTOMY WITH CYTOLOGIC WASHINGS (Bilateral: Abdomen)  Patient Location: PACU  Anesthesia Type:General  Level of Consciousness: drowsy and patient cooperative  Airway & Oxygen Therapy: Patient Spontanous Breathing and Patient connected to face mask oxygen  Post-op Assessment: Report given to RN, Post -op Vital signs reviewed and stable, and Patient moving all extremities  Post vital signs: Reviewed and stable  Last Vitals:  Vitals Value Taken Time  BP 141/69 02/25/23 1416  Temp    Pulse 64 02/25/23 1420  Resp 15 02/25/23 1420  SpO2 100 % 02/25/23 1420  Vitals shown include unfiled device data.  Last Pain:  Vitals:   02/25/23 1031  PainSc: 0-No pain      Patients Stated Pain Goal: 0 (02/25/23 1031)  Complications: No notable events documented.

## 2023-02-25 NOTE — Anesthesia Postprocedure Evaluation (Signed)
Anesthesia Post Note  Patient: ASHLINN HEMRICK  Procedure(s) Performed: LAPAROSCOPIC BILATERAL SALPINGO OOPHORECTOMY WITH CYTOLOGIC WASHINGS (Bilateral: Abdomen)     Patient location during evaluation: PACU Anesthesia Type: General Level of consciousness: awake and alert Pain management: pain level controlled Vital Signs Assessment: post-procedure vital signs reviewed and stable Respiratory status: spontaneous breathing, nonlabored ventilation and respiratory function stable Cardiovascular status: blood pressure returned to baseline Postop Assessment: no apparent nausea or vomiting Anesthetic complications: no   No notable events documented.  Last Vitals:  Vitals:   02/25/23 1020 02/25/23 1416  BP: (!) 143/74 (!) 141/69  Pulse: 64 66  Resp: 18 17  Temp: 36.6 C 36.6 C  SpO2: 99% 100%    Last Pain:  Vitals:   02/25/23 1416  PainSc: 0-No pain                 Shanda Howells

## 2023-02-25 NOTE — Anesthesia Preprocedure Evaluation (Addendum)
Anesthesia Evaluation  Patient identified by MRN, date of birth, ID band Patient awake    Reviewed: Allergy & Precautions, NPO status , Patient's Chart, lab work & pertinent test results  History of Anesthesia Complications Negative for: history of anesthetic complications  Airway Mallampati: II  TM Distance: >3 FB Neck ROM: Full    Dental no notable dental hx.    Pulmonary neg pulmonary ROS   Pulmonary exam normal        Cardiovascular hypertension, Pt. on medications Normal cardiovascular exam  Echo 06/14/22: EF 60-65%, mild LVH, grade I DD, mild LAE/RAE, mild MR   Neuro/Psych negative neurological ROS  negative psych ROS   GI/Hepatic negative GI ROS, Neg liver ROS,,,  Endo/Other  negative endocrine ROS    Renal/GU negative Renal ROS  negative genitourinary   Musculoskeletal negative musculoskeletal ROS (+)    Abdominal   Peds  Hematology negative hematology ROS (+)   Anesthesia Other Findings Day of surgery medications reviewed with patient.  Reproductive/Obstetrics left ovarian neoplasm                             Anesthesia Physical Anesthesia Plan  ASA: 2  Anesthesia Plan: General   Post-op Pain Management: Tylenol PO (pre-op)*   Induction: Intravenous  PONV Risk Score and Plan: 3 and Treatment may vary due to age or medical condition, Ondansetron, Dexamethasone and Midazolam  Airway Management Planned: Oral ETT  Additional Equipment: None  Intra-op Plan:   Post-operative Plan: Extubation in OR  Informed Consent: I have reviewed the patients History and Physical, chart, labs and discussed the procedure including the risks, benefits and alternatives for the proposed anesthesia with the patient or authorized representative who has indicated his/her understanding and acceptance.     Dental advisory given  Plan Discussed with: CRNA  Anesthesia Plan Comments:         Anesthesia Quick Evaluation

## 2023-02-25 NOTE — Discharge Instructions (Signed)
Post-surgical Instructions, Outpatient Surgery  You may expect to feel dizzy, weak, and drowsy for as long as 24 hours after receiving the medicine that made you sleep (anesthetic). For the first 24 hours after your surgery:   Do not drive a car, ride a bicycle, participate in physical activities, or take public transportation until you are done taking narcotic pain medicines or as directed by Dr. Hyacinth Meeker.  Do not drink alcohol or take tranquilizers.  Do not take medicine that has not been prescribed by your physicians.  Do not sign important papers or make important decisions while on narcotic pain medicines.  Have a responsible person with you.   CARE OF INCISION If you have a bandage, you may remove it in one day.  If there are steri-strips or dermabond, just let this loosen on its own.  You may shower on the first day after your surgery.  Do not sit in a tub bath for one week. Avoid heavy lifting (more than 10 pounds/4.5 kilograms), pushing, or pulling.  Avoid activities that may risk injury to your incisions.   PAIN MANAGEMENT Motrin 800mg .  (This is the same as 4-200mg  over the counter tablets of Motrin or ibuprofen.)  You may take this every eight hours or as needed for cramping.   Vicodin 5/325mg .  For more severe pain, take one or two tablets every four to six hours as needed for pain control.  (Remember that narcotic pain medications increase your risk of constipation.  If this becomes a problem, you may take an over the counter stool softener like Colace 100mg  up to four times a day.)  DO'S AND DON'T'S Do not take a tub bath for one week.  You may shower on the first day after your surgery Do not do any heavy lifting for one to two weeks.  This increases the chance of bleeding. Do move around as you feel able.  Stairs are fine.  You may begin to exercise again as you feel able.  Do not lift any weights for two weeks. Do not put anything in the vagina for two weeks--no tampons,  intercourse, or douching.  Do not use any vaginal estrogen cream for two weeks as well.  REGULAR MEDIATIONS/VITAMINS: You may restart all of your regular medications as prescribed. You may restart all of your vitamins as you normally take them.    PLEASE CALL OR SEEK MEDICAL CARE IF: You have persistent nausea and vomiting.  You have trouble eating or drinking.  You have an oral temperature above 100.5.  You have constipation that is not helped by adjusting diet or increasing fluid intake. Pain medicines are a common cause of constipation.  You have heavy vaginal bleeding You have redness or drainage from your incision(s) or there is increasing pain or tenderness near or in the surgical site.

## 2023-02-25 NOTE — H&P (Signed)
Shannon Dickson is an 73 y.o. female DWF with hx of ~7cm right simple appearing ovarian cyst that was noted on ultrasound done 12/18/2022.  Ovarian cyst did not have any abnormal blood flow and cas-125 was normal at 17. She is here for left salpingo-oophorectomy with right salpingectomy and possible right oophorectomy.  It is her preference to keep her right ovary.  She is aware there is likely very little hormonal production from this ovary and that there is no well established benefit to keeping the right ovary.  She still desires to keep the ovary.  Procedure, risks and benefits have been discussed.  She understands the only alternative to this is just monitoring.  She desired to proceed with removal.    Pertinent Gynecological History: Menses: post-menopausal Bleeding: none Contraception: tubal ligation DES exposure: denies Blood transfusions: none Sexually transmitted diseases: no past history Previous GYN Procedures:  laparoscopic BTL   Last mammogram: normal Date: 12/19/2022 Last pap: normal Date: 02/20/2018 OB History: G0, P0   Menstrual History: Patient's last menstrual period was 08/21/2007 (exact date).    Past Medical History:  Diagnosis Date   BMI 27.0-27.9,adult    Cat scratch fever    Exertional dyspnea    Fatigue    HSV infection    HTN (hypertension)    Hypercholesterolemia    MVP (mitral valve prolapse)    about 30 yrs ago   SOB (shortness of breath) 01/15/2012   negative cardio, ENT, and pulmonary evaluation   Thyroid nodule     Past Surgical History:  Procedure Laterality Date   BREAST BIOPSY Right 01/23/2010   fibroadenoma w/microcalcifications   KNEE ARTHROSCOPY Right 03/14/2008   KNEE SURGERY Left 01/14/2006   arthroscopy   SHOULDER SURGERY Right 12/15/2007       TUBAL LIGATION  2004    Family History  Problem Relation Age of Onset   Diabetes Mother    Diabetes Sister        deceased   Asthma Maternal Aunt     Social History:  reports that she has  never smoked. She has never used smokeless tobacco. She reports current alcohol use of about 2.0 standard drinks of alcohol per week. She reports that she does not use drugs.  Allergies:  Allergies  Allergen Reactions   Penicillins     Not Pure but can have it mixed with other things    Medications Prior to Admission  Medication Sig Dispense Refill Last Dose/Taking   amLODipine (NORVASC) 5 MG tablet Take 5 mg by mouth daily.   02/25/2023 at  9:00 AM   APPLE CIDER VINEGAR PO Take 1 tablet by mouth daily.   02/24/2023   ASTAXANTHIN PO Take 1 capsule by mouth daily.   Past Week   BIOTIN PO Take 1 tablet by mouth daily.   02/24/2023   CALCIUM PO Take 1 tablet by mouth daily.   02/24/2023   rosuvastatin (CRESTOR) 20 MG tablet Take 20 mg by mouth daily.   02/25/2023 at  9:00 AM   trospium (SANCTURA) 20 MG tablet Take 1 tablet (20 mg total) by mouth 2 (two) times daily. 60 tablet 2 02/25/2023 at  9:00 AM   VITAMIN D PO Take 1 tablet by mouth daily.   Past Week   estradiol (ESTRACE) 0.1 MG/GM vaginal cream Place 1 Applicatorful vaginally at bedtime. Place 1 gm vaginally twice weekly 42.5 g 12 Not Taking    Review of Systems  Constitutional: Negative.   Gastrointestinal: Negative.  Genitourinary: Negative.     Blood pressure (!) 143/74, pulse 64, temperature 97.9 F (36.6 C), resp. rate 18, height 5\' 3"  (1.6 m), weight 67.1 kg, last menstrual period 08/21/2007, SpO2 99%. Physical Exam Constitutional:      Appearance: Normal appearance.  Cardiovascular:     Rate and Rhythm: Normal rate and regular rhythm.  Pulmonary:     Effort: Pulmonary effort is normal.     Breath sounds: Normal breath sounds.  Neurological:     General: No focal deficit present.     Mental Status: She is alert.  Psychiatric:        Mood and Affect: Mood normal.     Results for orders placed or performed during the hospital encounter of 02/25/23 (from the past 24 hours)  Type and screen     Status: None    Collection Time: 02/25/23 10:45 AM  Result Value Ref Range   ABO/RH(D) O POS    Antibody Screen NEG    Sample Expiration      02/28/2023,2359 Performed at [facility], [address], [place], [place] [address]   CBC     Status: Abnormal   Collection Time: 02/25/23 10:55 AM  Result Value Ref Range   WBC 5.5 4.0 - 10.5 K/uL   RBC 5.13 (H) 3.87 - 5.11 MIL/uL   Hemoglobin 14.0 12.0 - 15.0 g/dL   HCT 60.4 54.0 - 98.1 %   MCV 85.8 80.0 - 100.0 fL   MCH 27.3 26.0 - 34.0 pg   MCHC 31.8 30.0 - 36.0 g/dL   RDW 19.1 47.8 - 29.5 %   Platelets 230 150 - 400 K/uL   nRBC 0.0 0.0 - 0.2 %  Basic metabolic panel per protocol     Status: Abnormal   Collection Time: 02/25/23 10:55 AM  Result Value Ref Range   Sodium 135 135 - 145 mmol/L   Potassium 3.9 3.5 - 5.1 mmol/L   Chloride 109 98 - 111 mmol/L   CO2 22 22 - 32 mmol/L   Glucose, Bld 100 (H) 70 - 99 mg/dL   BUN 16 8 - 23 mg/dL   Creatinine, Ser 6.21 0.44 - 1.00 mg/dL   Calcium 8.3 (L) 8.9 - 10.3 mg/dL   GFR, Estimated >30 >86 mL/min   Anion gap 4 (L) 5 - 15    No results found.  Assessment/Plan: 73 yo G0P0 DWF with ~7cm right ovarian cyst here for surgical treatment with right salping oophorectomy, and left salpingectomy, possible left oophorectomy.  Questions answered.  Pt ready to proceed.  Jerene Bears 02/25/2023, 12:14 PM

## 2023-02-25 NOTE — Anesthesia Procedure Notes (Signed)
Procedure Name: Intubation Date/Time: 02/25/2023 1:12 PM  Performed by: Chari Manning, RNPre-anesthesia Checklist: Patient identified, Patient being monitored, Timeout performed, Emergency Drugs available and Suction available Patient Re-evaluated:Patient Re-evaluated prior to induction Oxygen Delivery Method: Circle system utilized Preoxygenation: Pre-oxygenation with 100% oxygen Induction Type: IV induction Ventilation: Mask ventilation without difficulty Laryngoscope Size: Mac and 3 Grade View: Grade II Tube type: Oral Tube size: 7.0 mm Number of attempts: 1 Airway Equipment and Method: Stylet Placement Confirmation: ETT inserted through vocal cords under direct vision, positive ETCO2 and breath sounds checked- equal and bilateral Secured at: 23 cm Tube secured with: Tape Dental Injury: Teeth and Oropharynx as per pre-operative assessment  Comments: atraumatic

## 2023-02-26 ENCOUNTER — Encounter (HOSPITAL_COMMUNITY): Payer: Self-pay | Admitting: Obstetrics & Gynecology

## 2023-02-26 LAB — SURGICAL PATHOLOGY

## 2023-02-26 LAB — CYTOLOGY - NON PAP

## 2023-03-19 ENCOUNTER — Encounter (HOSPITAL_BASED_OUTPATIENT_CLINIC_OR_DEPARTMENT_OTHER): Payer: Self-pay | Admitting: Obstetrics & Gynecology

## 2023-03-26 ENCOUNTER — Encounter (HOSPITAL_BASED_OUTPATIENT_CLINIC_OR_DEPARTMENT_OTHER): Payer: Self-pay | Admitting: Obstetrics & Gynecology

## 2023-03-27 ENCOUNTER — Encounter (HOSPITAL_BASED_OUTPATIENT_CLINIC_OR_DEPARTMENT_OTHER): Payer: Self-pay | Admitting: Obstetrics & Gynecology

## 2023-03-27 ENCOUNTER — Ambulatory Visit (HOSPITAL_BASED_OUTPATIENT_CLINIC_OR_DEPARTMENT_OTHER): Payer: Medicare Other | Admitting: Obstetrics & Gynecology

## 2023-03-27 VITALS — BP 165/80 | HR 68 | Wt 149.2 lb

## 2023-03-27 DIAGNOSIS — Z9079 Acquired absence of other genital organ(s): Secondary | ICD-10-CM

## 2023-03-27 DIAGNOSIS — Z90721 Acquired absence of ovaries, unilateral: Secondary | ICD-10-CM

## 2023-03-27 NOTE — Progress Notes (Signed)
 GYNECOLOGY  VISIT  CC:   post op recheck  HPI: 73 y.o. G0P0000 Divorced White or Caucasian female here for recheck after undergoing Laparoscopic LSO with washings on 02/25/2023.  Reports she has done well from post op standpoint.  Took a few tablets of the pain medication.  Bowel function is back to normal.  Urination was normal.  Denies vaginal bleeding.  She has no pain.     Pathology reviewed:  Yes .  Pictures reviewed.  Questions answered.    MEDS:   Current Outpatient Medications on File Prior to Visit  Medication Sig Dispense Refill   amLODipine (NORVASC) 5 MG tablet Take 5 mg by mouth daily.     APPLE CIDER VINEGAR PO Take 1 tablet by mouth daily.     ASTAXANTHIN PO Take 1 capsule by mouth daily.     BIOTIN PO Take 1 tablet by mouth daily.     CALCIUM PO Take 1 tablet by mouth daily.     estradiol (ESTRACE) 0.1 MG/GM vaginal cream Place 1 Applicatorful vaginally at bedtime. Place 1 gm vaginally twice weekly 42.5 g 12   HYDROcodone-acetaminophen (NORCO/VICODIN) 5-325 MG tablet Take 1-2 tablets by mouth every 6 (six) hours as needed for moderate pain (pain score 4-6) or severe pain (pain score 7-10). 15 tablet 0   rosuvastatin (CRESTOR) 20 MG tablet Take 20 mg by mouth daily.     trospium (SANCTURA) 20 MG tablet Take 1 tablet (20 mg total) by mouth 2 (two) times daily. 60 tablet 2   VITAMIN D PO Take 1 tablet by mouth daily.     No current facility-administered medications on file prior to visit.    SH:  Smoking - No    PHYSICAL EXAMINATION:    BP (!) 165/80 (BP Location: Left Arm, Patient Position: Sitting, Cuff Size: Large)   Pulse 68   Wt 149 lb 3.2 oz (67.7 kg)   LMP 08/21/2007 (Exact Date)   BMI 26.43 kg/m     General appearance: alert, cooperative and appears stated age CV:  Regular rate and rhythm Lungs:  clear to auscultation, no wheezes, rales or rhonchi, symmetric air entry Abdomen: soft, non-tender; bowel sounds normal; no masses,  no organomegaly Incisions:   C/D/I   Assessment/Plan: 1. History of salpingoophorectomy (left) - doing well post op - she will follow up 1 year or prn

## 2023-03-28 ENCOUNTER — Other Ambulatory Visit: Payer: Self-pay | Admitting: Obstetrics and Gynecology

## 2023-04-14 DIAGNOSIS — M25561 Pain in right knee: Secondary | ICD-10-CM | POA: Diagnosis not present

## 2023-05-07 DIAGNOSIS — R296 Repeated falls: Secondary | ICD-10-CM | POA: Diagnosis not present

## 2023-05-07 DIAGNOSIS — M5430 Sciatica, unspecified side: Secondary | ICD-10-CM | POA: Diagnosis not present

## 2023-05-09 ENCOUNTER — Ambulatory Visit
Admission: RE | Admit: 2023-05-09 | Discharge: 2023-05-09 | Disposition: A | Discharge status: Home / Self Care | Source: Ambulatory Visit | Attending: Family Medicine | Admitting: Family Medicine

## 2023-05-09 ENCOUNTER — Other Ambulatory Visit: Payer: Self-pay | Admitting: Family Medicine

## 2023-05-09 DIAGNOSIS — M545 Low back pain, unspecified: Secondary | ICD-10-CM | POA: Diagnosis not present

## 2023-05-09 DIAGNOSIS — M542 Cervicalgia: Secondary | ICD-10-CM

## 2023-05-09 DIAGNOSIS — M546 Pain in thoracic spine: Secondary | ICD-10-CM | POA: Diagnosis not present

## 2023-05-09 DIAGNOSIS — M50321 Other cervical disc degeneration at C4-C5 level: Secondary | ICD-10-CM | POA: Diagnosis not present

## 2023-05-09 DIAGNOSIS — M5033 Other cervical disc degeneration, cervicothoracic region: Secondary | ICD-10-CM | POA: Diagnosis not present

## 2023-05-09 DIAGNOSIS — M50322 Other cervical disc degeneration at C5-C6 level: Secondary | ICD-10-CM | POA: Diagnosis not present

## 2023-05-09 DIAGNOSIS — M50323 Other cervical disc degeneration at C6-C7 level: Secondary | ICD-10-CM | POA: Diagnosis not present

## 2023-07-07 DIAGNOSIS — M47816 Spondylosis without myelopathy or radiculopathy, lumbar region: Secondary | ICD-10-CM | POA: Diagnosis not present

## 2023-07-07 DIAGNOSIS — M9901 Segmental and somatic dysfunction of cervical region: Secondary | ICD-10-CM | POA: Diagnosis not present

## 2023-07-07 DIAGNOSIS — H5203 Hypermetropia, bilateral: Secondary | ICD-10-CM | POA: Diagnosis not present

## 2023-07-07 DIAGNOSIS — M9903 Segmental and somatic dysfunction of lumbar region: Secondary | ICD-10-CM | POA: Diagnosis not present

## 2023-07-07 DIAGNOSIS — M62830 Muscle spasm of back: Secondary | ICD-10-CM | POA: Diagnosis not present

## 2023-07-07 DIAGNOSIS — M5382 Other specified dorsopathies, cervical region: Secondary | ICD-10-CM | POA: Diagnosis not present

## 2023-07-07 DIAGNOSIS — M9902 Segmental and somatic dysfunction of thoracic region: Secondary | ICD-10-CM | POA: Diagnosis not present

## 2023-07-09 DIAGNOSIS — Z6824 Body mass index (BMI) 24.0-24.9, adult: Secondary | ICD-10-CM | POA: Diagnosis not present

## 2023-07-09 DIAGNOSIS — R051 Acute cough: Secondary | ICD-10-CM | POA: Diagnosis not present

## 2023-07-10 DIAGNOSIS — M62830 Muscle spasm of back: Secondary | ICD-10-CM | POA: Diagnosis not present

## 2023-07-10 DIAGNOSIS — M47816 Spondylosis without myelopathy or radiculopathy, lumbar region: Secondary | ICD-10-CM | POA: Diagnosis not present

## 2023-07-10 DIAGNOSIS — M9901 Segmental and somatic dysfunction of cervical region: Secondary | ICD-10-CM | POA: Diagnosis not present

## 2023-07-10 DIAGNOSIS — M5382 Other specified dorsopathies, cervical region: Secondary | ICD-10-CM | POA: Diagnosis not present

## 2023-07-10 DIAGNOSIS — M9903 Segmental and somatic dysfunction of lumbar region: Secondary | ICD-10-CM | POA: Diagnosis not present

## 2023-07-10 DIAGNOSIS — M9902 Segmental and somatic dysfunction of thoracic region: Secondary | ICD-10-CM | POA: Diagnosis not present

## 2023-07-21 DIAGNOSIS — I10 Essential (primary) hypertension: Secondary | ICD-10-CM | POA: Diagnosis not present

## 2023-07-21 DIAGNOSIS — L237 Allergic contact dermatitis due to plants, except food: Secondary | ICD-10-CM | POA: Diagnosis not present

## 2023-07-21 DIAGNOSIS — Z6824 Body mass index (BMI) 24.0-24.9, adult: Secondary | ICD-10-CM | POA: Diagnosis not present

## 2023-08-25 DIAGNOSIS — M9901 Segmental and somatic dysfunction of cervical region: Secondary | ICD-10-CM | POA: Diagnosis not present

## 2023-08-25 DIAGNOSIS — M62830 Muscle spasm of back: Secondary | ICD-10-CM | POA: Diagnosis not present

## 2023-08-25 DIAGNOSIS — M9903 Segmental and somatic dysfunction of lumbar region: Secondary | ICD-10-CM | POA: Diagnosis not present

## 2023-08-25 DIAGNOSIS — M47816 Spondylosis without myelopathy or radiculopathy, lumbar region: Secondary | ICD-10-CM | POA: Diagnosis not present

## 2023-08-25 DIAGNOSIS — M5382 Other specified dorsopathies, cervical region: Secondary | ICD-10-CM | POA: Diagnosis not present

## 2023-08-25 DIAGNOSIS — M9902 Segmental and somatic dysfunction of thoracic region: Secondary | ICD-10-CM | POA: Diagnosis not present

## 2023-10-02 ENCOUNTER — Ambulatory Visit
Attending: Student in an Organized Health Care Education/Training Program | Admitting: Student in an Organized Health Care Education/Training Program

## 2023-10-02 ENCOUNTER — Other Ambulatory Visit: Payer: Self-pay

## 2023-10-02 ENCOUNTER — Encounter: Payer: Self-pay | Admitting: Student in an Organized Health Care Education/Training Program

## 2023-10-02 VITALS — BP 122/60 | HR 66 | Ht 63.5 in | Wt 147.0 lb

## 2023-10-02 DIAGNOSIS — I10 Essential (primary) hypertension: Secondary | ICD-10-CM

## 2023-10-02 DIAGNOSIS — I341 Nonrheumatic mitral (valve) prolapse: Secondary | ICD-10-CM

## 2023-10-02 DIAGNOSIS — E782 Mixed hyperlipidemia: Secondary | ICD-10-CM

## 2023-10-02 NOTE — Progress Notes (Signed)
 Cardiology Office Note:   Date:  10/02/2023  ID:  Shannon Dickson, DOB 1950/07/21, MRN [id number] PCP: Delayne Artist PARAS, MD  [facility] Providers Cardiologist:  Georganna Archer, MD { Chief Complaint:  Chief Complaint  Patient presents with   Mitral Valve Prolapse      History of Present Illness:   Shannon Dickson is a 73 y.o. female with a PMH of HLD, HTN, MVP who presents for follow up.  Patient presents today for follow-up.  Last seen 1 year ago by Dr. Alveta.  She is doing great without any complaints.  She remains active playing pickle ball and tennis.  She denies chest pain, syncope, presyncope, palpitations, PND, orthopnea, swelling.  She gets some DOE that is chronic for her and has not worsened.  Last echocardiogram showed mild MR and normal heart function.  She is taking her amlodipine and rosuvastatin as prescribed without side effect.  No further concerns.   Past Medical History:  Diagnosis Date   BMI 27.0-27.9,adult    Cat scratch fever    Exertional dyspnea    Fatigue    HSV infection    HTN (hypertension)    Hypercholesterolemia    MVP (mitral valve prolapse)    about 30 yrs ago   SOB (shortness of breath) 01/15/2012   negative cardio, ENT, and pulmonary evaluation   Thyroid  nodule      Studies Reviewed:    EKG:  EKG Interpretation Date/Time:  Thursday October 02 2023 16:04:20 EDT Ventricular Rate:  66 PR Interval:  148 QRS Duration:  88 QT Interval:  386 QTC Calculation: 404 R Axis:   -5  Text Interpretation: Normal sinus rhythm Normal ECG When compared with ECG of 23-Dec-2007 07:42, No significant change was found Confirmed by Archer Georganna [phone]) on 10/02/2023 4:09:51 PM     Cardiac Studies & Procedures   ______________________________________________________________________________________________     ECHOCARDIOGRAM  ECHOCARDIOGRAM COMPLETE 06/14/2022  Narrative ECHOCARDIOGRAM REPORT    Patient Name:   Shannon Dickson  Date of Exam:  06/14/2022 Medical Rec #:  [id number]     Height:       63.5 in Accession #:    [id number]    Weight:       155.0 lb Date of Birth:  March 30, 1950      BSA:          1.745 m Patient Age:    72 years      BP:           120/70 mmHg Patient Gender: F             HR:           58 bpm. Exam Location:  [address]  Procedure: 2D Echo, 3D Echo, Cardiac Doppler and Color Doppler  Indications:    I34.0 Mitral Regurgitation  History:        Patient has prior history of Echocardiogram examinations, most recent 02/12/2021. Mitral Valve Prolapse, Signs/Symptoms:Fatigue and Shortness of Breath; Risk Factors:Hypertension and Dyslipidemia.  Sonographer:    Carl Rodgers-Jones RDCS Referring Phys: [id number] PHILIP J NAHSER  IMPRESSIONS   1. Left ventricular ejection fraction, by estimation, is 60 to 65%. The left ventricle has normal function. The left ventricle has no regional wall motion abnormalities. There is mild left ventricular hypertrophy of the basal-septal segment. Left ventricular diastolic parameters are consistent with Grade I diastolic dysfunction (impaired relaxation). 2. Right ventricular systolic function is normal. The right ventricular size is normal. 3. Left  atrial size was mildly dilated. 4. Right atrial size was mildly dilated. 5. The mitral valve is degenerative. Mild mitral valve regurgitation. No evidence of mitral stenosis. 6. The aortic valve is tricuspid. There is mild calcification of the aortic valve. Aortic valve regurgitation is not visualized. Aortic valve sclerosis/calcification is present, without any evidence of aortic stenosis. 7. The inferior vena cava is normal in size with greater than 50% respiratory variability, suggesting right atrial pressure of 3 mmHg.  FINDINGS Left Ventricle: Left ventricular ejection fraction, by estimation, is 60 to 65%. The left ventricle has normal function. The left ventricle has no regional wall motion abnormalities. The left ventricular  internal cavity size was normal in size. There is mild left ventricular hypertrophy of the basal-septal segment. Left ventricular diastolic parameters are consistent with Grade I diastolic dysfunction (impaired relaxation).  Right Ventricle: The right ventricular size is normal. No increase in right ventricular wall thickness. Right ventricular systolic function is normal.  Left Atrium: Left atrial size was mildly dilated.  Right Atrium: Right atrial size was mildly dilated.  Pericardium: There is no evidence of pericardial effusion.  Mitral Valve: The mitral valve is degenerative in appearance. Mild mitral annular calcification. Mild mitral valve regurgitation. No evidence of mitral valve stenosis.  Tricuspid Valve: The tricuspid valve is normal in structure. Tricuspid valve regurgitation is trivial. No evidence of tricuspid stenosis.  Aortic Valve: The aortic valve is tricuspid. There is mild calcification of the aortic valve. Aortic valve regurgitation is not visualized. Aortic valve sclerosis/calcification is present, without any evidence of aortic stenosis.  Pulmonic Valve: The pulmonic valve was normal in structure. Pulmonic valve regurgitation is trivial. No evidence of pulmonic stenosis.  Aorta: The aortic root is normal in size and structure.  Venous: The inferior vena cava is normal in size with greater than 50% respiratory variability, suggesting right atrial pressure of 3 mmHg.  IAS/Shunts: No atrial level shunt detected by color flow Doppler.   LEFT VENTRICLE PLAX 2D LVIDd:         3.80 cm   Diastology LVIDs:         2.60 cm   LV e' medial:    6.74 cm/s LV PW:         0.90 cm   LV E/e' medial:  14.6 LV IVS:        0.90 cm   LV e' lateral:   9.30 cm/s LVOT diam:     1.80 cm   LV E/e' lateral: 10.6 LV SV:         65 LV SV Index:   37 LVOT Area:     2.54 cm  3D Volume EF: 3D EF:        62 % LV EDV:       126 ml LV ESV:       48 ml LV SV:        78 ml  RIGHT  VENTRICLE RV Basal diam:  3.40 cm RV S prime:     14.30 cm/s TAPSE (M-mode): 3.0 cm  LEFT ATRIUM             Index        RIGHT ATRIUM           Index LA diam:        4.60 cm 2.64 cm/m   RA Area:     13.30 cm LA Vol (A2C):   43.2 ml 24.75 ml/m  RA Volume:   34.80 ml  19.94 ml/m  LA Vol (A4C):   37.5 ml 21.49 ml/m LA Biplane Vol: 41.7 ml 23.89 ml/m AORTIC VALVE LVOT Vmax:   122.50 cm/s LVOT Vmean:  77.550 cm/s LVOT VTI:    0.256 m  AORTA Ao Root diam: 3.00 cm Ao Asc diam:  3.40 cm  MITRAL VALVE                TRICUSPID VALVE MV Area (PHT): 3.81 cm     TR Peak grad:   18.1 mmHg MV Decel Time: 199 msec     TR Vmax:        213.00 cm/s MR Peak grad: 112.8 mmHg MR Vmax:      531.00 cm/s   SHUNTS MV E velocity: 98.50 cm/s   Systemic VTI:  0.26 m MV A velocity: 114.00 cm/s  Systemic Diam: 1.80 cm MV E/A ratio:  0.86  Toribio Fuel MD Electronically signed by Toribio Fuel MD Signature Date/Time: 06/14/2022/2:49:51 PM    Final      CT SCANS  CT CARDIAC SCORING (SELF PAY ONLY) 06/17/2022  Addendum 06/22/2022 12:44 AM ADDENDUM REPORT: 06/22/2022 00:41  EXAM: OVER-READ INTERPRETATION  CT CHEST  The following report is an over-read performed by radiologist Dr. Suzen Dials of [facility] on 06/22/2022. This over-read does not include interpretation of cardiac or coronary anatomy or pathology. The coronary calcium score/coronary CTA interpretation by the cardiologist is attached.  COMPARISON:  None.  FINDINGS: Cardiovascular: There are no significant extracardiac vascular findings.  Mediastinum/Nodes: There are no enlarged lymph nodes within the visualized mediastinum.  Lungs/Pleura: There is no pleural effusion. The visualized lungs appear clear.  Upper abdomen: No significant findings in the visualized upper abdomen.  Musculoskeletal/Chest wall: No chest wall mass or suspicious osseous findings within the visualized  chest.  IMPRESSION: No significant extracardiac findings within the visualized chest.   Electronically Signed By: Suzen Dials M.D. On: 06/22/2022 00:41  Narrative CLINICAL DATA:  Cardiovascular Disease Risk stratification  EXAM: Coronary Calcium Score  TECHNIQUE: A gated, non-contrast computed tomography scan of the heart was performed using 3 mm slice thickness. Axial images were analyzed on a dedicated workstation. Calcium scoring of the coronary arteries was performed using the Agatston method.  FINDINGS: Coronary arteries: Normal origins.  Coronary Calcium Score:  Left main: 0  Left anterior descending artery: 0  Left circumflex artery: 0  Right coronary artery: 0  Total: 0  Percentile: 0  Pericardium: Normal.  Aorta: Normal caliber.  Mild aortic valve leaflet calcification.  Mild posterior mitral annular calcification.  Non-cardiac: See separate report from [facility].  IMPRESSION: 1. Coronary calcium score of 0. 2. Mild aortic valve leaflet calcification. 3. Mild posterior mitral annular calcification.  RECOMMENDATIONS: Coronary artery calcium (CAC) score is a strong predictor of incident coronary heart disease (CHD) and provides predictive information beyond traditional risk factors. CAC scoring is reasonable to use in the decision to withhold, postpone, or initiate statin therapy in intermediate-risk or selected borderline-risk asymptomatic adults (age 39-75 years and LDL-C >=70 to <190 mg/dL) who do not have diabetes or established atherosclerotic cardiovascular disease (ASCVD).* In intermediate-risk (10-year ASCVD risk >=7.5% to <20%) adults or selected borderline-risk (10-year ASCVD risk >=5% to <7.5%) adults in whom a CAC score is measured for the purpose of making a treatment decision the following recommendations have been made:  If CAC=0, it is reasonable to withhold statin therapy and reassess in 5 to 10 years, as  long as higher risk conditions are absent (diabetes mellitus, family  history of premature CHD in first degree relatives (males <55 years; females <65 years), cigarette smoking, or LDL >=190 mg/dL).  If CAC is 1 to 99, it is reasonable to initiate statin therapy for patients >=40 years of age.  If CAC is >=100 or >=75th percentile, it is reasonable to initiate statin therapy at any age.  Cardiology referral should be considered for patients with CAC scores >=400 or >=75th percentile.  *2018 AHA/ACC/AACVPR/AAPA/ABC/ACPM/ADA/AGS/APhA/ASPC/NLA/PCNA Guideline on the Management of Blood Cholesterol: A Report of the American College of Cardiology/American Heart Association Task Force on Clinical Practice Guidelines. J Am Coll Cardiol. 2019;73(24):3168-3209.  Vinie Maxcy, MD  Electronically Signed: By: Vinie JAYSON Maxcy M.D. On: 06/17/2022 19:37     ______________________________________________________________________________________________      Risk Assessment/Calculations:              Physical Exam:     VS:  BP 122/60 (BP Location: Right Arm, Patient Position: Sitting, Cuff Size: Normal)   Pulse 66   Ht 5' 3.5 (1.613 m)   Wt 147 lb (66.7 kg)   LMP 08/21/2007 (Exact Date)   SpO2 97%   BMI 25.63 kg/m      Wt Readings from Last 3 Encounters:  03/27/23 149 lb 3.2 oz (67.7 kg)  02/25/23 148 lb (67.1 kg)  02/10/23 149 lb 6.4 oz (67.8 kg)     GEN: Well nourished, well developed, in no acute distress NECK: No JVD; No carotid bruits CARDIAC: RRR, II/VI systolic murmur at apex, no rubs or gallops RESPIRATORY:  Clear to auscultation without rales, wheezing or rhonchi  ABDOMEN: Soft, non-tender, non-distended, normal bowel sounds EXTREMITIES:  Warm and well perfused, no edema; No deformity, 2+ radial pulses PSYCH: Normal mood and affect   Assessment & Plan Mitral valve prolapse She is doing great without symptoms.  Her MVP is very mild as well as her MR.  Given the  stability of her MVP she can follow-up on a as needed basis.  Mixed hyperlipidemia Tolerating Crestor well.  Will check lipid panel today.  Primary hypertension Blood pressure is at goal.  Continue amlodipine 5 mg daily.          This note was written with the assistance of a dictation microphone or AI dictation software. Please excuse any typos or grammatical errors.   Signed, Georganna Archer, MD 10/02/2023 3:58 PM    [facility]

## 2023-10-02 NOTE — Patient Instructions (Addendum)
 Medication Instructions:  Your physician recommends that you continue on your current medications as directed. Please refer to the Current Medication list given to you today.  *If you need a refill on your cardiac medications before your next appointment, please call your pharmacy*  Lab Work: Your physician recommends that you have lab work today- Lipid Panel  If you have labs (blood work) drawn today and your tests are completely normal, you will receive your results only by: MyChart Message (if you have MyChart) OR A paper copy in the mail If you have any lab test that is abnormal or we need to change your treatment, we will call you to review the results.  Testing/Procedures: None ordered today.  Follow-Up: At [facility], you and your health needs are our priority.  As part of our continuing mission to provide you with exceptional heart care, our providers are all part of one team.  This team includes your primary Cardiologist (physician) and Advanced Practice Providers or APPs (Physician Assistants and Nurse Practitioners) who all work together to provide you with the care you need, when you need it.  Your next appointment:   As needed  Provider:   Georganna Archer, MD    We recommend signing up for the patient portal called MyChart.  Sign up information is provided on this After Visit Summary.  MyChart is used to connect with patients for Virtual Visits (Telemedicine).  Patients are able to view lab/test results, encounter notes, upcoming appointments, etc.  Non-urgent messages can be sent to your provider as well.   To learn more about what you can do with MyChart, go to [email or web address].

## 2023-10-02 NOTE — Assessment & Plan Note (Signed)
 She is doing great without symptoms.  Her MVP is very mild as well as her MR.  Given the stability of her MVP she can follow-up on a as needed basis.

## 2023-10-03 ENCOUNTER — Ambulatory Visit: Payer: Self-pay | Admitting: Student in an Organized Health Care Education/Training Program

## 2023-10-03 LAB — LIPID PANEL
Chol/HDL Ratio: 2.4 ratio (ref 0.0–4.4)
Cholesterol, Total: 161 mg/dL (ref 100–199)
HDL: 66 mg/dL (ref >39)
LDL Chol Calc (NIH): 65 mg/dL (ref 0–99)
Triglycerides: 183 mg/dL — ABNORMAL HIGH (ref 0–149)
VLDL Cholesterol Cal: 30 mg/dL (ref 5–40)

## 2023-12-19 ENCOUNTER — Ambulatory Visit (HOSPITAL_BASED_OUTPATIENT_CLINIC_OR_DEPARTMENT_OTHER): Payer: Medicare Other | Admitting: Radiology

## 2023-12-20 ENCOUNTER — Other Ambulatory Visit: Payer: Self-pay | Admitting: Medical Genetics

## 2023-12-22 ENCOUNTER — Encounter (HOSPITAL_BASED_OUTPATIENT_CLINIC_OR_DEPARTMENT_OTHER): Payer: Self-pay | Admitting: Certified Nurse Midwife

## 2023-12-22 ENCOUNTER — Ambulatory Visit (HOSPITAL_BASED_OUTPATIENT_CLINIC_OR_DEPARTMENT_OTHER): Admitting: Certified Nurse Midwife

## 2023-12-22 VITALS — BP 145/58 | HR 58 | Ht 66.0 in | Wt 143.2 lb

## 2023-12-22 DIAGNOSIS — L900 Lichen sclerosus et atrophicus: Secondary | ICD-10-CM

## 2023-12-22 DIAGNOSIS — N9089 Other specified noninflammatory disorders of vulva and perineum: Secondary | ICD-10-CM

## 2023-12-22 MED ORDER — CLOBETASOL PROPIONATE 0.05 % EX OINT: TOPICAL_OINTMENT | CUTANEOUS | 2 refills | Status: AC

## 2023-12-23 ENCOUNTER — Telehealth (HOSPITAL_BASED_OUTPATIENT_CLINIC_OR_DEPARTMENT_OTHER): Payer: Self-pay

## 2023-12-23 NOTE — Telephone Encounter (Signed)
 Patient would like to know what she can use for hair loss

## 2023-12-26 ENCOUNTER — Encounter (HOSPITAL_BASED_OUTPATIENT_CLINIC_OR_DEPARTMENT_OTHER): Payer: Self-pay

## 2023-12-26 ENCOUNTER — Ambulatory Visit (HOSPITAL_BASED_OUTPATIENT_CLINIC_OR_DEPARTMENT_OTHER): Admitting: Radiology

## 2023-12-26 NOTE — Progress Notes (Signed)
 Subjective:     Shannon Dickson is a 73 y.o. female who presents for evaluation of an abnormal vulvar itching. Pt is also experiencing a sensation that the clitoris is being stimulated leading to intense orgasms. She has never experienced these symptoms prior. Symptoms started a few weeks ago. She denies vaginal spotting or bleeding. Denies urinary or intravaginal complaints.  The following portions of the patient's history were reviewed and updated as appropriate: allergies, current medications, past family history, past medical history, past social history, past surgical history, and problem list.   Review of Systems Pertinent items are noted in HPI.    Objective:    BP (!) 145/58   Pulse (!) 58   Ht 5' 6 (1.676 m) Comment: Reported  Wt 143 lb 3.2 oz (65 kg)   LMP 08/21/2007   BMI 23.11 kg/m  General appearance: alert, cooperative, and appears stated age Pelvic: vagina normal without discharge and externally there are some mild changes noted around the labia majora and rectum (appears to have some whitening/plaque changes) that may be consistent with Lichen Sclerosus. Area (2-41mm) brown spot noted at vaginal introitus towards perineum. There are no lesions or other skin changes noted.     Assessment:    Lichen Sclerosus.  Vulvar Lesion   Plan:   Discussed options for management.  Pt will apply Clobetasol  ointment .05% (dime-sized/sparingly) twice a day for 2 weeks then decrease to twice weekly. If this therapy resolves the vulvar itching and the tiny lesion resolves, we will continue the Clobetasol  twice weekly as maintenance therapy. Pt will RTO for recheck in 2-3 weeks. If small lesion does not resolve, we will plan vulvar biopsy at that time. Follow-up scheduled for 01/06/24. Pt verbalized understanding. Arland MARLA Roller

## 2023-12-29 ENCOUNTER — Other Ambulatory Visit (HOSPITAL_BASED_OUTPATIENT_CLINIC_OR_DEPARTMENT_OTHER): Payer: Self-pay

## 2023-12-29 DIAGNOSIS — L659 Nonscarring hair loss, unspecified: Secondary | ICD-10-CM

## 2024-01-06 ENCOUNTER — Encounter (HOSPITAL_BASED_OUTPATIENT_CLINIC_OR_DEPARTMENT_OTHER): Payer: Self-pay | Admitting: Certified Nurse Midwife

## 2024-01-06 ENCOUNTER — Ambulatory Visit (HOSPITAL_BASED_OUTPATIENT_CLINIC_OR_DEPARTMENT_OTHER): Admitting: Certified Nurse Midwife

## 2024-01-06 VITALS — BP 133/71 | HR 54 | Ht 63.5 in | Wt 143.2 lb

## 2024-01-06 DIAGNOSIS — L900 Lichen sclerosus et atrophicus: Secondary | ICD-10-CM | POA: Diagnosis not present

## 2024-01-06 NOTE — Progress Notes (Signed)
 Subjective:     Shannon Dickson is a 73 y.o. female who presents for follow-up. Pt was diagnosed with Lichen Sclerosus a few weeks ago and started Clobetasol  ointment sparingly twice a day. Today the patient reports that she feels much better and symptoms have resolved. She will now reduce use to sparingly once or twice weekly.   The following portions of the patient's history were reviewed and updated as appropriate: allergies, current medications, past family history, past medical history, past social history, past surgical history, and problem list.   Review of Systems Pertinent items are noted in HPI.    Objective:    BP 133/71   Pulse (!) 54   Ht 5' 3.5 (1.613 m)   Wt 143 lb 3.2 oz (65 kg)   LMP 08/21/2007   BMI 24.97 kg/m  General appearance: alert and cooperative Pelvic: small single herpetic? Lesion left labia majora. No other lesions.     Assessment:    Lichen Sclerosus.    Plan:    Pt reports relief with Clobetasol  ointment. She will continue to use sparingly (less than fingertip amount) once or twice weekly unless LS flares. RTO as scheduled for annual gynecological exam.  Shannon Dickson

## 2024-01-26 ENCOUNTER — Encounter: Payer: Self-pay | Admitting: *Deleted

## 2024-02-12 ENCOUNTER — Other Ambulatory Visit

## 2024-02-12 DIAGNOSIS — Z006 Encounter for examination for normal comparison and control in clinical research program: Secondary | ICD-10-CM
